# Patient Record
Sex: Female | Born: 1980
Health system: Southern US, Community
[De-identification: ages and names within clinical notes are randomized; demographics above are authoritative.]

## PROBLEM LIST (undated history)

## (undated) DIAGNOSIS — E079 Disorder of thyroid, unspecified: Secondary | ICD-10-CM

## (undated) DIAGNOSIS — F32A Depression, unspecified: Secondary | ICD-10-CM

## (undated) DIAGNOSIS — E063 Autoimmune thyroiditis: Secondary | ICD-10-CM

## (undated) DIAGNOSIS — E039 Hypothyroidism, unspecified: Secondary | ICD-10-CM

## (undated) DIAGNOSIS — F419 Anxiety disorder, unspecified: Secondary | ICD-10-CM

## (undated) HISTORY — PX: ANAL FISSURECTOMY: SUR608

## (undated) HISTORY — DX: Anxiety disorder, unspecified: F41.9

## (undated) HISTORY — DX: Autoimmune thyroiditis: E06.3

## (undated) HISTORY — DX: Depression, unspecified: F32.A

---

## 2005-04-11 ENCOUNTER — Ambulatory Visit: Payer: Self-pay | Admitting: Family Medicine

## 2005-07-19 ENCOUNTER — Ambulatory Visit: Payer: Self-pay | Admitting: Family Medicine

## 2005-07-31 ENCOUNTER — Inpatient Hospital Stay: Payer: Self-pay | Admitting: Obstetrics and Gynecology

## 2008-05-06 ENCOUNTER — Emergency Department: Payer: Self-pay | Admitting: Emergency Medicine

## 2008-08-30 ENCOUNTER — Emergency Department: Payer: Self-pay | Admitting: Emergency Medicine

## 2008-09-09 ENCOUNTER — Emergency Department: Payer: Self-pay | Admitting: Emergency Medicine

## 2009-03-23 ENCOUNTER — Inpatient Hospital Stay: Payer: Self-pay | Admitting: Obstetrics and Gynecology

## 2011-04-06 ENCOUNTER — Emergency Department: Payer: Self-pay | Admitting: Emergency Medicine

## 2013-07-04 ENCOUNTER — Emergency Department: Payer: Self-pay | Admitting: Emergency Medicine

## 2013-07-10 ENCOUNTER — Emergency Department: Payer: Self-pay | Admitting: Emergency Medicine

## 2013-07-14 ENCOUNTER — Emergency Department: Payer: Self-pay | Admitting: Emergency Medicine

## 2014-01-05 ENCOUNTER — Emergency Department: Payer: Self-pay | Admitting: Emergency Medicine

## 2014-01-08 ENCOUNTER — Emergency Department: Payer: Self-pay | Admitting: Emergency Medicine

## 2014-01-08 LAB — CBC
HCT: 36.2 % (ref 35.0–47.0)
HGB: 11.6 g/dL — AB (ref 12.0–16.0)
MCH: 26.9 pg (ref 26.0–34.0)
MCHC: 32.2 g/dL (ref 32.0–36.0)
MCV: 84 fL (ref 80–100)
Platelet: 285 10*3/uL (ref 150–440)
RBC: 4.33 10*6/uL (ref 3.80–5.20)
RDW: 14.1 % (ref 11.5–14.5)
WBC: 14.3 10*3/uL — ABNORMAL HIGH (ref 3.6–11.0)

## 2014-01-08 LAB — URINALYSIS, COMPLETE
Bacteria: NONE SEEN
Bilirubin,UR: NEGATIVE
Blood: NEGATIVE
GLUCOSE, UR: NEGATIVE mg/dL (ref 0–75)
Ketone: NEGATIVE
Leukocyte Esterase: NEGATIVE
Nitrite: NEGATIVE
PROTEIN: NEGATIVE
Ph: 7 (ref 4.5–8.0)
RBC,UR: 3 /HPF (ref 0–5)
SPECIFIC GRAVITY: 1.021 (ref 1.003–1.030)

## 2014-01-08 LAB — COMPREHENSIVE METABOLIC PANEL
AST: 14 U/L — AB (ref 15–37)
Albumin: 3.4 g/dL (ref 3.4–5.0)
Alkaline Phosphatase: 58 U/L
Anion Gap: 4 — ABNORMAL LOW (ref 7–16)
BUN: 11 mg/dL (ref 7–18)
Bilirubin,Total: 0.3 mg/dL (ref 0.2–1.0)
CALCIUM: 8.6 mg/dL (ref 8.5–10.1)
Chloride: 103 mmol/L (ref 98–107)
Co2: 29 mmol/L (ref 21–32)
Creatinine: 0.78 mg/dL (ref 0.60–1.30)
EGFR (African American): >60
Glucose: 77 mg/dL (ref 65–99)
Osmolality: 270 (ref 275–301)
POTASSIUM: 3.9 mmol/L (ref 3.5–5.1)
SGPT (ALT): 16 U/L (ref 12–78)
SODIUM: 136 mmol/L (ref 136–145)
Total Protein: 7.5 g/dL (ref 6.4–8.2)

## 2014-08-13 ENCOUNTER — Emergency Department: Payer: Self-pay | Admitting: Internal Medicine

## 2014-08-13 LAB — COMPREHENSIVE METABOLIC PANEL
ALBUMIN: 3.8 g/dL (ref 3.4–5.0)
ALK PHOS: 31 U/L — AB
ALT: 9 U/L — AB
AST: 12 U/L — AB (ref 15–37)
Anion Gap: 6 — ABNORMAL LOW (ref 7–16)
BUN: 16 mg/dL (ref 7–18)
Bilirubin,Total: 0.3 mg/dL (ref 0.2–1.0)
CALCIUM: 8 mg/dL — AB (ref 8.5–10.1)
CHLORIDE: 107 mmol/L (ref 98–107)
Co2: 28 mmol/L (ref 21–32)
Creatinine: 0.73 mg/dL (ref 0.60–1.30)
Glucose: 81 mg/dL (ref 65–99)
Osmolality: 281 (ref 275–301)
Potassium: 3.8 mmol/L (ref 3.5–5.1)
Sodium: 141 mmol/L (ref 136–145)
TOTAL PROTEIN: 7.1 g/dL (ref 6.4–8.2)

## 2014-08-13 LAB — CBC WITH DIFFERENTIAL/PLATELET
BASOS PCT: 1.4 %
Basophil #: 0.1 10*3/uL (ref 0.0–0.1)
EOS ABS: 0.2 10*3/uL (ref 0.0–0.7)
Eosinophil %: 3.3 %
HCT: 38.4 % (ref 35.0–47.0)
HGB: 12.3 g/dL (ref 12.0–16.0)
Lymphocyte #: 1.6 10*3/uL (ref 1.0–3.6)
Lymphocyte %: 26.6 %
MCH: 27.9 pg (ref 26.0–34.0)
MCHC: 32.2 g/dL (ref 32.0–36.0)
MCV: 87 fL (ref 80–100)
MONO ABS: 0.4 x10 3/mm (ref 0.2–0.9)
MONOS PCT: 7.2 %
NEUTROS ABS: 3.8 10*3/uL (ref 1.4–6.5)
NEUTROS PCT: 61.5 %
Platelet: 271 10*3/uL (ref 150–440)
RBC: 4.42 10*6/uL (ref 3.80–5.20)
RDW: 14.1 % (ref 11.5–14.5)
WBC: 6.2 10*3/uL (ref 3.6–11.0)

## 2014-08-13 LAB — URINALYSIS, COMPLETE
BACTERIA: NONE SEEN
BILIRUBIN, UR: NEGATIVE
Blood: NEGATIVE
Glucose,UR: NEGATIVE mg/dL (ref 0–75)
Ketone: NEGATIVE
Leukocyte Esterase: NEGATIVE
Nitrite: NEGATIVE
PROTEIN: NEGATIVE
Ph: 6 (ref 4.5–8.0)
RBC,UR: 4 /HPF (ref 0–5)
SPECIFIC GRAVITY: 1.017 (ref 1.003–1.030)

## 2014-08-13 LAB — LIPASE, BLOOD: Lipase: 151 U/L (ref 73–393)

## 2014-08-16 ENCOUNTER — Emergency Department: Payer: Self-pay | Admitting: Emergency Medicine

## 2014-08-16 LAB — TROPONIN I: Troponin-I: <0.02 ng/mL

## 2015-01-19 ENCOUNTER — Emergency Department: Payer: Self-pay | Admitting: Emergency Medicine

## 2015-01-19 LAB — BASIC METABOLIC PANEL
Anion Gap: 8 (ref 7–16)
BUN: 10 mg/dL (ref 7–18)
CALCIUM: 8.5 mg/dL (ref 8.5–10.1)
CO2: 25 mmol/L (ref 21–32)
Chloride: 109 mmol/L — ABNORMAL HIGH (ref 98–107)
Creatinine: 0.64 mg/dL (ref 0.60–1.30)
EGFR (African American): >60
EGFR (Non-African Amer.): >60
Glucose: 56 mg/dL — ABNORMAL LOW (ref 65–99)
Osmolality: 280 (ref 275–301)
POTASSIUM: 4.1 mmol/L (ref 3.5–5.1)
Sodium: 142 mmol/L (ref 136–145)

## 2015-01-19 LAB — HCG, QUANTITATIVE, PREGNANCY: Beta Hcg, Quant.: 30062 m[IU]/mL — ABNORMAL HIGH

## 2015-01-19 LAB — CBC
HCT: 35.6 % (ref 35.0–47.0)
HGB: 11.6 g/dL — ABNORMAL LOW (ref 12.0–16.0)
MCH: 28.3 pg (ref 26.0–34.0)
MCHC: 32.6 g/dL (ref 32.0–36.0)
MCV: 87 fL (ref 80–100)
PLATELETS: 338 10*3/uL (ref 150–440)
RBC: 4.11 10*6/uL (ref 3.80–5.20)
RDW: 14 % (ref 11.5–14.5)
WBC: 11.5 10*3/uL — ABNORMAL HIGH (ref 3.6–11.0)

## 2015-12-21 ENCOUNTER — Ambulatory Visit: Payer: Self-pay

## 2016-01-10 ENCOUNTER — Ambulatory Visit: Payer: Self-pay

## 2016-01-10 ENCOUNTER — Encounter (INDEPENDENT_AMBULATORY_CARE_PROVIDER_SITE_OTHER): Payer: Self-pay

## 2017-12-18 ENCOUNTER — Telehealth: Payer: Self-pay | Admitting: Pharmacy Technician

## 2017-12-18 NOTE — Telephone Encounter (Signed)
Patient failed to provide current poi.  No additional medication assistance will be provided by [facility] without the required proof of income documentation.  Patient notified by letter.  Betty J. Kluttz Care Manager [facility] 

## 2018-07-14 DIAGNOSIS — E039 Hypothyroidism, unspecified: Secondary | ICD-10-CM | POA: Insufficient documentation

## 2019-08-19 ENCOUNTER — Other Ambulatory Visit: Payer: Self-pay

## 2019-08-19 ENCOUNTER — Encounter: Payer: Self-pay | Admitting: Emergency Medicine

## 2019-08-19 ENCOUNTER — Emergency Department
Admission: EM | Admit: 2019-08-19 | Discharge: 2019-08-19 | Disposition: A | Discharge status: Home / Self Care | Payer: Medicaid Other | Attending: Emergency Medicine | Admitting: Emergency Medicine

## 2019-08-19 DIAGNOSIS — R110 Nausea: Secondary | ICD-10-CM | POA: Diagnosis present

## 2019-08-19 DIAGNOSIS — R630 Anorexia: Secondary | ICD-10-CM | POA: Insufficient documentation

## 2019-08-19 DIAGNOSIS — F329 Major depressive disorder, single episode, unspecified: Secondary | ICD-10-CM | POA: Insufficient documentation

## 2019-08-19 DIAGNOSIS — F331 Major depressive disorder, recurrent, moderate: Secondary | ICD-10-CM | POA: Diagnosis not present

## 2019-08-19 DIAGNOSIS — R0602 Shortness of breath: Secondary | ICD-10-CM | POA: Insufficient documentation

## 2019-08-19 DIAGNOSIS — F32A Depression, unspecified: Secondary | ICD-10-CM

## 2019-08-19 HISTORY — DX: Disorder of thyroid, unspecified: E07.9

## 2019-08-19 LAB — URINALYSIS, COMPLETE (UACMP) WITH MICROSCOPIC
Bilirubin Urine: NEGATIVE
Glucose, UA: NEGATIVE mg/dL
Hgb urine dipstick: NEGATIVE
Ketones, ur: NEGATIVE mg/dL
Nitrite: NEGATIVE
Protein, ur: NEGATIVE mg/dL
Specific Gravity, Urine: 1.018 (ref 1.005–1.030)
pH: 7 (ref 5.0–8.0)

## 2019-08-19 LAB — CBC
HCT: 41.1 % (ref 36.0–46.0)
Hemoglobin: 13.2 g/dL (ref 12.0–15.0)
MCH: 26.5 pg (ref 26.0–34.0)
MCHC: 32.1 g/dL (ref 30.0–36.0)
MCV: 82.4 fL (ref 80.0–100.0)
Platelets: 352 10*3/uL (ref 150–400)
RBC: 4.99 MIL/uL (ref 3.87–5.11)
RDW: 14.6 % (ref 11.5–15.5)
WBC: 7 10*3/uL (ref 4.0–10.5)
nRBC: 0 % (ref 0.0–0.2)

## 2019-08-19 LAB — BASIC METABOLIC PANEL
Anion gap: 8 (ref 5–15)
BUN: 12 mg/dL (ref 6–20)
CO2: 23 mmol/L (ref 22–32)
Calcium: 9.1 mg/dL (ref 8.9–10.3)
Chloride: 108 mmol/L (ref 98–111)
Creatinine, Ser: 0.69 mg/dL (ref 0.44–1.00)
GFR calc Af Amer: >60 mL/min (ref >60)
GFR calc non Af Amer: >60 mL/min (ref >60)
Glucose, Bld: 88 mg/dL (ref 70–99)
Potassium: 4.4 mmol/L (ref 3.5–5.1)
Sodium: 139 mmol/L (ref 135–145)

## 2019-08-19 LAB — POC URINE PREG, ED: Preg Test, Ur: NEGATIVE

## 2019-08-19 LAB — TSH: TSH: 0.086 u[IU]/mL — ABNORMAL LOW (ref 0.350–4.500)

## 2019-08-19 MED ORDER — ONDANSETRON 4 MG PO TBDP
4.0000 mg | ORAL_TABLET | Freq: Once | ORAL | Status: AC
  Administered 2019-08-19: 17:00:00 4 mg via ORAL
  Filled 2019-08-19: qty 1

## 2019-08-19 MED ORDER — ESCITALOPRAM OXALATE 10 MG PO TABS: 10.0000 mg | ORAL_TABLET | Freq: Every day | ORAL | 2 refills | Status: DC

## 2019-08-19 MED ORDER — METOCLOPRAMIDE HCL 10 MG PO TABS: 10.0000 mg | ORAL_TABLET | Freq: Four times a day (QID) | ORAL | 0 refills | Status: DC | PRN

## 2019-08-19 MED ORDER — SODIUM CHLORIDE 0.9% FLUSH: 3.0000 mL | Freq: Once | INTRAVENOUS | Status: DC

## 2019-08-19 NOTE — BH Assessment (Signed)
Writer provided the patient with outpatient referral information: ____________________________ Follow up appointment w/[facility] Date:  Friday 28th, 2020 Time: 9:30am Address: [address],                     [place], [place] [facility] Counselor [place], [id number] [phone]

## 2019-08-19 NOTE — ED Triage Notes (Signed)
Pt c/o nausea x70month and states decreased sleep. States hx of hypothyroidism. PT tearful in triage. VSS . States increased weakness xfew days

## 2019-08-19 NOTE — Consult Note (Signed)
[facility]   Reason for Consult:  Depression Referring Physician:  EDP Patient Identification: Pamela Mooney MRN:  [id number] Principal Diagnosis: MDD (major depressive disorder), recurrent episode, moderate (HCC) Diagnosis:  Principal Problem:   MDD (major depressive disorder), recurrent episode, moderate (HCC)   Total Time spent with patient: 30 minutes  Subjective:   Pamela Mooney is a 38 y.o. female patient reports that she has been feeling very depressed recently.  She states that she has not been sleeping well and that she has had a loss of appetite but has not lost any weight yet.  She denies having any suicidal or homicidal ideations and denies any hallucinations.  She reports that she has been crying and feeling very depressed.  She states that this was mainly triggered due to both of her parents dying within 6 months of each other last year.  She states that she was incarcerated during that time due to a parole violation.  She states she got out of jail in October and has not really had any closure with her parents dying while she was incarcerated.  She states that in the past she was on Lexapro and Rexulti prior to being incarcerated but the jail did not carry this medications and she was switched to a different medication which did not work.  She states she has not followed up with anyone since she got out of jail in October.  She states that the Lexapro did help her when she was taking it and would like to restart it.  Patient is in agreement and wants to go to some therapy but is unsure for her to follow-up at as she had only been to 1 of the place which was [facility] prior to her incarceration.  HPI:  Per Dr. Lenard LancePaduchowski: 38 y.o. female with a past medical history of hypothyroidism presents to the emergency department for nausea, shortness of breath, decreased appetite, decreased sleep.  According to the patient she has been under a lot of stress lately, for the  past 1 to 2 months she feels like she is nauseated throughout the day, has not been sleeping well at night, has no appetite and no energy per patient.  Patient is quite tearful during examination.  Does have a history of depression as well.  Denies any SI or HI.  Patient called her doctor but cannot be seen for several weeks so she came to the emergency department.  Patient is seen by this provider face-to-face.  Patient is pleasant and cooperative but is extremely tearful during the entire evaluation.  Patient has continually denied any suicidal or homicidal ideations and denied any hallucinations.  Due to her continuous depression feel that the patient does need to have some treatment started.  Would recommend to start the patient on Lexapro 10 mg p.o. daily and for patient to follow-up with [facility] and with hospice for grievance counseling.  Social worker has already assist the patient with having a appointment with [facility] on Friday the 28th at 9:30 AM and is also provided the patient with the information for the hospice care center.  At this time the patient does not meet inpatient criteria and is psychiatrically cleared.  I have notified Dr. Lenard LancePaduchowski about the recommendations.  Past Psychiatric History: depression and anxiety  Risk to Self:   Risk to Others:   Prior Inpatient Therapy:   Prior Outpatient Therapy:    Past Medical History:  Past Medical History:  Diagnosis Date  .  Thyroid disease    History reviewed. No pertinent surgical history. Family History: No family history on file. Family Psychiatric  History: none reported Social History:  Social History   Substance and Sexual Activity  Alcohol Use Not Currently     Social History   Substance and Sexual Activity  Drug Use Never    Social History   Socioeconomic History  . Marital status: Married    Spouse name: Not on file  . Number of children: Not on file  . Years of education: Not on file  . Highest education level: Not  on file  Occupational History  . Not on file  Social Needs  . Financial resource strain: Not on file  . Food insecurity    Worry: Not on file    Inability: Not on file  . Transportation needs    Medical: Not on file    Non-medical: Not on file  Tobacco Use  . Smoking status: Never Smoker  . Smokeless tobacco: Never Used  Substance and Sexual Activity  . Alcohol use: Not Currently  . Drug use: Never  . Sexual activity: Not on file  Lifestyle  . Physical activity    Days per week: Not on file    Minutes per session: Not on file  . Stress: Not on file  Relationships  . Social Herbalist on phone: Not on file    Gets together: Not on file    Attends religious service: Not on file    Active member of club or organization: Not on file    Attends meetings of clubs or organizations: Not on file    Relationship status: Not on file  Other Topics Concern  . Not on file  Social History Narrative  . Not on file   Additional Social History:    Allergies:  No Known Allergies  Labs:  Results for orders placed or performed during the hospital encounter of 08/19/19 (from the past 48 hour(s))  Urinalysis, Complete w Microscopic     Status: Abnormal   Collection Time: 08/19/19  3:25 PM  Result Value Ref Range   Color, Urine YELLOW (A) YELLOW   APPearance HAZY (A) CLEAR   Specific Gravity, Urine 1.018 1.005 - 1.030   pH 7.0 5.0 - 8.0   Glucose, UA NEGATIVE NEGATIVE mg/dL   Hgb urine dipstick NEGATIVE NEGATIVE   Bilirubin Urine NEGATIVE NEGATIVE   Ketones, ur NEGATIVE NEGATIVE mg/dL   Protein, ur NEGATIVE NEGATIVE mg/dL   Nitrite NEGATIVE NEGATIVE   Leukocytes,Ua TRACE (A) NEGATIVE   RBC / HPF 0-5 0 - 5 RBC/hpf   WBC, UA 0-5 0 - 5 WBC/hpf   Bacteria, UA RARE (A) NONE SEEN   Squamous Epithelial / LPF 6-10 0 - 5   Mucus PRESENT     Comment: Performed at [facility], [address] [place], [place] [address]  Basic metabolic panel     Status: None    Collection Time: 08/19/19  3:26 PM  Result Value Ref Range   Sodium 139 135 - 145 mmol/L   Potassium 4.4 3.5 - 5.1 mmol/L   Chloride 108 98 - 111 mmol/L   CO2 23 22 - 32 mmol/L   Glucose, Bld 88 70 - 99 mg/dL   BUN 12 6 - 20 mg/dL   Creatinine, Ser 0.69 0.44 - 1.00 mg/dL   Calcium 9.1 8.9 - 10.3 mg/dL   GFR calc non Af Amer >60 >60 mL/min   GFR  calc Af Amer >60 >60 mL/min   Anion gap 8 5 - 15    Comment: Performed at [facility], [address] [place], [place] [address]  CBC     Status: None   Collection Time: 08/19/19  3:26 PM  Result Value Ref Range   WBC 7.0 4.0 - 10.5 K/uL   RBC 4.99 3.87 - 5.11 MIL/uL   Hemoglobin 13.2 12.0 - 15.0 g/dL   HCT 09.3 26.7 - 12.4 %   MCV 82.4 80.0 - 100.0 fL   MCH 26.5 26.0 - 34.0 pg   MCHC 32.1 30.0 - 36.0 g/dL   RDW 58.0 99.8 - 33.8 %   Platelets 352 150 - 400 K/uL   nRBC 0.0 0.0 - 0.2 %    Comment: Performed at [facility], [address] [place], [place] [address]  TSH     Status: Abnormal   Collection Time: 08/19/19  3:26 PM  Result Value Ref Range   TSH 0.086 (L) 0.350 - 4.500 uIU/mL    Comment: Performed by a 3rd Generation assay with a functional sensitivity of <=0.01 uIU/mL. Performed at [facility], [address] [place], [place] [address]   POC urine preg, ED     Status: None   Collection Time: 08/19/19  3:35 PM  Result Value Ref Range   Preg Test, Ur Negative Negative    No current facility-administered medications for this encounter.    No current outpatient medications on file.    Musculoskeletal: Strength & Muscle Tone: within normal limits Gait & Station: normal Patient leans: N/A  Psychiatric Specialty Exam: Physical Exam  Nursing note and vitals reviewed. Constitutional: She is oriented to person, place, and time. She appears well-developed and well-nourished.  Cardiovascular: Normal rate.  Respiratory: Effort normal.  Musculoskeletal: Normal range of motion.   Neurological: She is alert and oriented to person, place, and time.    Review of Systems  Constitutional: Negative.   HENT: Negative.   Eyes: Negative.   Respiratory: Negative.   Cardiovascular: Negative.   Gastrointestinal: Negative.   Genitourinary: Negative.   Musculoskeletal: Negative.   Skin: Negative.   Neurological: Negative.   Endo/Heme/Allergies: Negative.   Psychiatric/Behavioral: Positive for depression. The patient is nervous/anxious.     Blood pressure 108/77, pulse 63, resp. rate 18, last menstrual period 08/11/2019, SpO2 99 %.There is no height or weight on file to calculate BMI.  General Appearance: Disheveled  Eye Contact:  Good  Speech:  Clear and Coherent and Normal Rate  Volume:  Normal  Mood:  Depressed  Affect:  Depressed and Tearful  Thought Process:  Coherent and Descriptions of Associations: Intact  Orientation:  Full (Time, Place, and Person)  Thought Content:  WDL  Suicidal Thoughts:  No  Homicidal Thoughts:  No  Memory:  Immediate;   Good Recent;   Good Remote;   Good  Judgement:  Good  Insight:  Fair  Psychomotor Activity:  Normal  Concentration:  Concentration: Good  Recall:  Good  Fund of Knowledge:  Good  Language:  Good  Akathisia:  No  Handed:  Right  AIMS (if indicated):     Assets:  Communication Skills Desire for Improvement Financial Resources/Insurance Housing Physical Health Resilience Social Support Transportation  ADL's:  Intact  Cognition:  WNL  Sleep:        Treatment Plan Summary: Follow up with [facility] for therapy Follow up with hospice for grievance therapy Start Lexapro 10 mg PO Daily  Disposition: No evidence of imminent risk to self or others at present.   Patient does not meet criteria for psychiatric inpatient admission. Supportive therapy provided about ongoing stressors. Discussed crisis plan, support from social network, calling 911, coming to the Emergency Department, and calling Suicide  Hotline.  Gerlene Burdockravis B Padraig Nhan, FNP 08/19/2019 6:45 PM

## 2019-08-19 NOTE — BH Assessment (Signed)
Assessment Note  Pamela Mooney is an 38 y.o. female who presents to the ER due to having increase symptoms of depression. Patient initially thought her mood was a result of her thyroid. Patient reports, she had dealt with her current level of depression for several months. Symptoms include; crying spells, problems with sleep and appetite.  Patient further reports her both of her parents passed last year approximately six months apart. The anniversary of the mother's death was yesterday (08/18/2019).  During the interview, the patient was calm, cooperative and pleasant. She was able to provide appropriate answers to the questions. Patient was tearful throughout the interview. She had insight into some of the cause of her mental and emotional state. Patient feels guilty because she was incarcerated when her parents passed. She was "locked up" for a probation violation, "which was something simple. She states she hasn't had closure because she wasn't there physically.  Throughout the interview, the patient denied SI/HI and AV/H.  Patient lives with her husband and two children. Her family is supportive and serves as a protective factor for wanting to live and get better.    In the past, she received outpatient treatment with [facility] and was receiving medication management. When she was in jail, they were unable to continue her regiment of medication that work for her.  Diagnosis: Depression  Past Medical History:  Past Medical History:  Diagnosis Date  . Thyroid disease     History reviewed. No pertinent surgical history.  Family History: No family history on file.  Social History:  reports that she has never smoked. She has never used smokeless tobacco. She reports previous alcohol use. She reports that she does not use drugs.  Additional Social History:  Alcohol / Drug Use Pain Medications: See PTA Prescriptions: See PTA Over the Counter: See PTA History of alcohol / drug use?: No history of  alcohol / drug abuse Longest period of sobriety (when/how long): Reports of no use  CIWA: CIWA-Ar BP: 116/79 Pulse Rate: 63 COWS:    Allergies: No Known Allergies  Home Medications: (Not in a hospital admission)   OB/GYN Status:  Patient's last menstrual period was 08/11/2019.  General Assessment Data [facility] Assessment: In system Is this a Tele or Face-to-Face Assessment?: Face-to-Face Is this an Initial Assessment or a Re-assessment for this encounter?: Initial Assessment Language Other than English: No Living Arrangements: Other (Comment)(Private Home) What gender do you identify as?: Female Marital status: Married Pregnancy Status: No Living Arrangements: Spouse/significant other, Children Can pt return to current living arrangement?: Yes Admission Status: Voluntary Is patient capable of signing voluntary admission?: Yes Referral Source: Self/Family/Friend Insurance type: Medicaid  Medical Screening Exam [facility] Walk-in ONLY) Medical Exam completed: Yes  Crisis Care Plan Living Arrangements: Spouse/significant other, Children Legal Guardian: Other:(Self) Name of Psychiatrist: Reports of none Name of Therapist: Reports of none  Education Status Is patient currently in school?: No Is the patient employed, unemployed or receiving disability?: Employed  Risk to self with the past 6 months Suicidal Ideation: No Has patient been a risk to self within the past 6 months prior to admission? : No Suicidal Intent: No Has patient had any suicidal intent within the past 6 months prior to admission? : No Is patient at risk for suicide?: No Suicidal Plan?: No Has patient had any suicidal plan within the past 6 months prior to admission? : No Access to Means: No What has been your use of drugs/alcohol within the  last 12 months?: Reports of none Previous Attempts/Gestures: No How many times?: 0 Other Self Harm Risks: Reports of none Triggers for  Past Attempts: None known Intentional Self Injurious Behavior: None Family Suicide History: No Recent stressful life event(s): Other (Comment) Persecutory voices/beliefs?: No Depression: Yes Depression Symptoms: Tearfulness, Isolating, Fatigue, Guilt, Loss of interest in usual pleasures, Feeling worthless/self pity Substance abuse history and/or treatment for substance abuse?: No Suicide prevention information given to non-admitted patients: Not applicable  Risk to Others within the past 6 months Homicidal Ideation: No Does patient have any lifetime risk of violence toward others beyond the six months prior to admission? : No Thoughts of Harm to Others: No Current Homicidal Intent: No Current Homicidal Plan: No Access to Homicidal Means: No Identified Victim: Reports of none History of harm to others?: No Violent Behavior Description: Reports of none Does patient have access to weapons?: No Does patient have a court date: No Is patient on probation?: No  Psychosis Hallucinations: None noted Delusions: None noted  Mental Status Report Appearance/Hygiene: Unremarkable, In scrubs Eye Contact: Good Motor Activity: Freedom of movement, Unremarkable Speech: Logical/coherent, Unremarkable Level of Consciousness: Alert Mood: Depressed, Anxious, Sad, Pleasant Affect: Appropriate to circumstance, Depressed, Sad, Anxious Anxiety Level: Minimal Thought Processes: Coherent, Relevant Judgement: Unimpaired Orientation: Person, Place, Time, Situation, Appropriate for developmental age Obsessive Compulsive Thoughts/Behaviors: None  Cognitive Functioning Concentration: Decreased Memory: Recent Intact, Remote Intact Is patient IDD: No Insight: Good Impulse Control: Fair Appetite: Fair Have you had any weight changes? : No Change Sleep: No Change Total Hours of Sleep: 6 Vegetative Symptoms: None  ADLScreening [facility] Assessment Services) Patient's cognitive ability adequate to safely  complete daily activities?: Yes Patient able to express need for assistance with ADLs?: Yes Independently performs ADLs?: Yes (appropriate for developmental age)  Prior Inpatient Therapy Prior Inpatient Therapy: No  Prior Outpatient Therapy Prior Outpatient Therapy: Yes Prior Therapy Dates: "Years ago" Prior Therapy Facilty/Provider(s): [facility] Reason for Treatment: Depression Does patient have an ACCT team?: No Does patient have [facility] services? : No Does patient have P4CC services?: No  ADL Screening (condition at time of admission) Patient's cognitive ability adequate to safely complete daily activities?: Yes Is the patient deaf or have difficulty hearing?: No Does the patient have difficulty seeing, even when wearing glasses/contacts?: No Does the patient have difficulty concentrating, remembering, or making decisions?: No Patient able to express need for assistance with ADLs?: Yes Does the patient have difficulty dressing or bathing?: No Independently performs ADLs?: Yes (appropriate for developmental age) Does the patient have difficulty walking or climbing stairs?: No Weakness of Legs: None Weakness of Arms/Hands: None  Home Assistive Devices/Equipment Home Assistive Devices/Equipment: None  Therapy Consults (therapy consults require a physician order) PT Evaluation Needed: No OT Evalulation Needed: No SLP Evaluation Needed: No Abuse/Neglect Assessment (Assessment to be complete while patient is alone) Abuse/Neglect Assessment Can Be Completed: Yes Physical Abuse: Denies Verbal Abuse: Denies Sexual Abuse: Denies Exploitation of patient/patient's resources: Denies Self-Neglect: Denies Values / Beliefs Cultural Requests During Hospitalization: None Spiritual Requests During Hospitalization: None Consults Spiritual Care Consult Needed: No Social Work Consult Needed: No Regulatory affairs officer (For Healthcare) Does Patient Have a Medical Advance Directive?: No        Child/Adolescent Assessment Running Away Risk: Denies(Patient is an adult)  Disposition:  Disposition Initial Assessment Completed for this Encounter: Yes  On Site Evaluation by:   Reviewed with Physician:    Gunnar Fusi MS, [place], [facility], [place], [place] Therapeutic Triage Specialist 08/19/2019 9:14  PM

## 2019-08-19 NOTE — ED Provider Notes (Signed)
[facility] Emergency Department Provider Note  Time seen: 4:24 PM  I have reviewed the triage vital signs and the nursing notes.   HISTORY  Chief Complaint Nausea  HPI Pamela Mooney is a 38 y.o. female with a past medical history of hypothyroidism presents to the emergency department for nausea, shortness of breath, decreased appetite, decreased sleep.  According to the patient she has been under a lot of stress lately, for the past 1 to 2 months she feels like she is nauseated throughout the day, has not been sleeping well at night, has no appetite and no energy per patient.  Patient is quite tearful during examination.  Does have a history of depression as well.  Denies any SI or HI.  Patient called her doctor but cannot be seen for several weeks so she came to the emergency department.   Past Medical History:  Diagnosis Date  . Thyroid disease     There are no active problems to display for this patient.   History reviewed. No pertinent surgical history.  Prior to Admission medications   Not on File    No Known Allergies  No family history on file.  Social History Social History   Tobacco Use  . Smoking status: Never Smoker  . Smokeless tobacco: Never Used  Substance Use Topics  . Alcohol use: Not Currently  . Drug use: Never    Review of Systems Constitutional: Negative for fever. Cardiovascular: Negative for chest pain. Respiratory: Patient states mild shortness of breath x2 months which she describes as feeling like she occasionally needs to take a big deep breath. Gastrointestinal: Negative for abdominal pain Musculoskeletal: Negative for musculoskeletal complaints Neurological: Negative for headache All other ROS negative  ____________________________________________   PHYSICAL EXAM:  VITAL SIGNS: ED Triage Vitals [08/19/19 1521]  Enc Vitals Group     BP      Pulse      Resp      Temp      Temp src      SpO2    Weight      Height      Head Circumference      Peak Flow      Pain Score 5     Pain Loc      Pain Edu?      Excl. in GC?    Constitutional: Alert and oriented.  Tearful. Eyes: Normal exam ENT      Head: Normocephalic and atraumatic.      Mouth/Throat: Mucous membranes are moist. Cardiovascular: Normal rate, regular rhythm.  Respiratory: Normal respiratory effort without tachypnea nor retractions. Breath sounds are clear  Gastrointestinal: Soft and nontender. No distention.   Musculoskeletal: Nontender with normal range of motion in all extremities.  Neurologic:  Normal speech and language. No gross focal neurologic deficits  Skin:  Skin is warm, dry and intact.  Psychiatric: Mood and affect are normal.  ____________________________________________    EKG  EKG viewed and interpreted by myself shows a normal sinus rhythm 83 bpm with a narrow QRS, normal axis, normal intervals, no concerning ST changes.  ____________________________________________    INITIAL IMPRESSION / ASSESSMENT AND PLAN / ED COURSE  Pertinent labs & imaging results that were available during my care of the patient were reviewed by me and considered in my medical decision making (see chart for details).   Patient presents emergency department for complaints of nausea, decreased appetite, decreased energy level, fatigue.  Differential would include metabolic or  electrolyte abnormality, thyroid abnormality, depression.  Patient's labs are largely within normal limits including negative urinalysis and negative pregnancy test.  Blood work is largely normal.  Patient is quite tearful throughout exam.  Highly suspect depression to be playing a role in the patient's symptoms.  With the patient's work-up being normal, benign abdominal exam I do not believe imaging would be of benefit for the patient.  We will treat her nausea with Zofran in the emergency department although it seems to be fairly minimal at this point.   Given the patient being tearful we will consult psychiatry and have them evaluate the patient as well.  Medical work-up is largely nonrevealing.  Patient has been seen by psychiatry they referred for outpatient therapy they wish to start the patient on 10 mg of Lexapro daily which I will write for the patient.  Patient agreeable plan of care and we will discharge from the emergency department at this time.   Pamela Mooney was evaluated in Emergency Department on 08/19/2019 for the symptoms described in the history of present illness. She was evaluated in the context of the global COVID-19 pandemic, which necessitated consideration that the patient might be at risk for infection with the SARS-CoV-2 virus that causes COVID-19. Institutional protocols and algorithms that pertain to the evaluation of patients at risk for COVID-19 are in a state of rapid change based on information released by regulatory bodies including the CDC and federal and state organizations. These policies and algorithms were followed during the patient's care in the ED.  ____________________________________________   FINAL CLINICAL IMPRESSION(S) / ED DIAGNOSES  Nausea Depression   Harvest Dark, MD 08/19/19 1905

## 2020-01-18 LAB — FETAL NONSTRESS TEST

## 2020-04-05 ENCOUNTER — Other Ambulatory Visit: Payer: Self-pay

## 2020-04-05 ENCOUNTER — Emergency Department: Payer: Medicaid Other

## 2020-04-05 ENCOUNTER — Emergency Department
Admission: EM | Admit: 2020-04-05 | Discharge: 2020-04-05 | Disposition: A | Discharge status: Home / Self Care | Payer: Medicaid Other | Attending: Emergency Medicine | Admitting: Emergency Medicine

## 2020-04-05 DIAGNOSIS — Y92009 Unspecified place in unspecified non-institutional (private) residence as the place of occurrence of the external cause: Secondary | ICD-10-CM

## 2020-04-05 DIAGNOSIS — Y999 Unspecified external cause status: Secondary | ICD-10-CM | POA: Insufficient documentation

## 2020-04-05 DIAGNOSIS — Z23 Encounter for immunization: Secondary | ICD-10-CM | POA: Insufficient documentation

## 2020-04-05 DIAGNOSIS — W19XXXA Unspecified fall, initial encounter: Secondary | ICD-10-CM

## 2020-04-05 DIAGNOSIS — S81812A Laceration without foreign body, left lower leg, initial encounter: Secondary | ICD-10-CM

## 2020-04-05 DIAGNOSIS — Y939 Activity, unspecified: Secondary | ICD-10-CM | POA: Diagnosis not present

## 2020-04-05 DIAGNOSIS — M25512 Pain in left shoulder: Secondary | ICD-10-CM | POA: Diagnosis not present

## 2020-04-05 DIAGNOSIS — Y929 Unspecified place or not applicable: Secondary | ICD-10-CM | POA: Diagnosis not present

## 2020-04-05 DIAGNOSIS — M25511 Pain in right shoulder: Secondary | ICD-10-CM | POA: Diagnosis not present

## 2020-04-05 DIAGNOSIS — S81012A Laceration without foreign body, left knee, initial encounter: Secondary | ICD-10-CM | POA: Diagnosis not present

## 2020-04-05 DIAGNOSIS — W01198A Fall on same level from slipping, tripping and stumbling with subsequent striking against other object, initial encounter: Secondary | ICD-10-CM | POA: Diagnosis not present

## 2020-04-05 DIAGNOSIS — M549 Dorsalgia, unspecified: Secondary | ICD-10-CM | POA: Insufficient documentation

## 2020-04-05 DIAGNOSIS — S8992XA Unspecified injury of left lower leg, initial encounter: Secondary | ICD-10-CM | POA: Diagnosis present

## 2020-04-05 MED ORDER — LIDOCAINE HCL (PF) 1 % IJ SOLN
5.0000 mL | Freq: Once | INTRAMUSCULAR | Status: AC
  Administered 2020-04-05: 5 mL
  Filled 2020-04-05: qty 5

## 2020-04-05 MED ORDER — ONDANSETRON 4 MG PO TBDP
4.0000 mg | ORAL_TABLET | Freq: Once | ORAL | Status: AC
  Administered 2020-04-05: 4 mg via ORAL
  Filled 2020-04-05: qty 1

## 2020-04-05 MED ORDER — HYDROCODONE-ACETAMINOPHEN 5-325 MG PO TABS
1.0000 | ORAL_TABLET | Freq: Once | ORAL | Status: AC
  Administered 2020-04-05: 1 via ORAL
  Filled 2020-04-05: qty 1

## 2020-04-05 MED ORDER — TETANUS-DIPHTH-ACELL PERTUSSIS 5-2.5-18.5 LF-MCG/0.5 IM SUSP
0.5000 mL | Freq: Once | INTRAMUSCULAR | Status: AC
  Administered 2020-04-05: 0.5 mL via INTRAMUSCULAR
  Filled 2020-04-05: qty 0.5

## 2020-04-05 NOTE — ED Notes (Signed)
See triage note  Presents with puncture wound   Laceration to left knee

## 2020-04-05 NOTE — Discharge Instructions (Signed)
Keep the wound clean, dry, and covered.  Use only soap and water to cleanse area as needed.  Change dressings daily.  Follow-up with [place] urgent care or local urgent care provider for suture removal in 10 to 12 days.

## 2020-04-05 NOTE — ED Triage Notes (Signed)
Pt states she tripped and fell landing on a piece of wood that punctured medial to the left knee. Bleeding is controlled.

## 2020-04-05 NOTE — ED Provider Notes (Signed)
[facility] Emergency Department Provider Note ____________________________________________  Time seen: 1800  I have reviewed the triage vital signs and the nursing notes.  HISTORY  Chief Complaint  Puncture Wound  HPI Pamela Mooney is a 39 y.o. female presents to the ED for evaluation of left knee pain.   Patient describes tripping and falling, and landed on a piece of wood that apparently cut the medial aspect of her left knee.  She presents, bleeding control to the left knee.  She also reports some mild bilateral upper shoulder and mid back pain secondary to the fall mechanism.  She denies any head injury or loss of consciousness.  Past Medical History:  Diagnosis Date  . Thyroid disease     Patient Active Problem List   Diagnosis Date Noted  . MDD (major depressive disorder), recurrent episode, moderate (HCC) 08/19/2019    History reviewed. No pertinent surgical history.  Prior to Admission medications   Medication Sig Start Date End Date Taking? Authorizing Provider  escitalopram (LEXAPRO) 10 MG tablet Take 1 tablet (10 mg total) by mouth daily. 08/19/19 11/17/19  Minna Antis, MD  metoCLOPramide (REGLAN) 10 MG tablet Take 1 tablet (10 mg total) by mouth every 6 (six) hours as needed for nausea. 08/19/19   Minna Antis, MD    Allergies Patient has no known allergies.  History reviewed. No pertinent family history.  Social History Social History   Tobacco Use  . Smoking status: Never Smoker  . Smokeless tobacco: Never Used  Substance Use Topics  . Alcohol use: Not Currently  . Drug use: Never    Review of Systems  Constitutional: Negative for fever. Eyes: Negative for visual changes. ENT: Negative for sore throat. Cardiovascular: Negative for chest pain. Respiratory: Negative for shortness of breath. Gastrointestinal: Negative for abdominal pain, vomiting and diarrhea. Genitourinary: Negative for dysuria. Musculoskeletal:  Negative for back pain.  Bilateral upper trapezius muscle pain as above. Skin: Negative for rash.  Left leg laceration as above. Neurological: Negative for headaches, focal weakness or numbness. ____________________________________________  PHYSICAL EXAM:  VITAL SIGNS: ED Triage Vitals  Enc Vitals Group     BP 04/05/20 1730 (!) 170/112     Pulse Rate 04/05/20 1729 99     Resp 04/05/20 1729 16     Temp 04/05/20 1729 98.3 F (36.8 C)     Temp Source 04/05/20 1729 Oral     SpO2 04/05/20 1729 100 %     Weight 04/05/20 1730 190 lb (86.2 kg)     Height 04/05/20 1730 5\' 7"  (1.702 m)     Head Circumference --      Peak Flow --      Pain Score 04/05/20 1730 8     Pain Loc --      Pain Edu? --      Excl. in GC? --     Constitutional: Alert and oriented. Well appearing and in no distress. Head: Normocephalic and atraumatic. Eyes: Conjunctivae are normal. PERRL. Normal extraocular movements Ears: Canals clear. TMs intact bilaterally. Nose: No congestion/rhinorrhea/epistaxis. Mouth/Throat: Mucous membranes are moist. Neck: Supple. No thyromegaly. Hematological/Lymphatic/Immunological: No cervical lymphadenopathy. Cardiovascular: Normal rate, regular rhythm. Normal distal pulses. Respiratory: Normal respiratory effort. No wheezes/rales/rhonchi. Gastrointestinal: Soft and nontender. No distention. Musculoskeletal: Nontender with normal range of motion in all extremities.  Neurologic:  Normal gait without ataxia. Normal speech and language. No gross focal neurologic deficits are appreciated. Skin:  Skin is warm, dry and intact. No rash noted. ____________________________________________  RADIOLOGY  DG Left Knee IMPRESSION: Mild soft tissue swelling medially. No radiopaque foreign body is noted. ____________________________________________  PROCEDURES  Tdap 0.5 ml IM  .Marland KitchenLaceration Repair  Date/Time: 04/05/2020 7:01 PM Performed by: Melvenia Needles, PA-C Authorized  by: Melvenia Needles, PA-C   Consent:    Consent obtained:  Verbal   Consent given by:  Patient   Risks discussed:  Pain and poor wound healing Anesthesia (see MAR for exact dosages):    Anesthesia method:  Local infiltration   Local anesthetic:  Lidocaine 1% w/o epi Laceration details:    Location:  Leg   Leg location:  L upper leg   Length (cm):  5   Depth (mm):  5 Repair type:    Repair type:  Simple Pre-procedure details:    Preparation:  Patient was prepped and draped in usual sterile fashion and imaging obtained to evaluate for foreign bodies Exploration:    Contaminated: no   Treatment:    Area cleansed with:  Betadine and saline   Amount of cleaning:  Standard   Irrigation method:  Syringe Skin repair:    Repair method:  Sutures   Suture size:  4-0   Suture material:  Nylon   Suture technique:  Simple interrupted   Number of sutures:  7 Approximation:    Approximation:  Close Post-procedure details:    Dressing:  Non-adherent dressing and bulky dressing   Patient tolerance of procedure:  Tolerated well, no immediate complications   ____________________________________________  INITIAL IMPRESSION / ASSESSMENT AND PLAN / ED COURSE  Patient with ED evaluation of an accidental laceration to the left leg following a mechanical fall.  Injury occurred after she fell against a piece of wood.  The wound involved extension into the subcutaneous fat, but no retained foreign bodies were explored in the wound bed.  Good wound edge approximation was achieved using nylon sutures.  Patient is discharged with wound care instructions and supplies.  She will follow-up with [place] urgent care for suture removal in 10 to 12 days.  Tetanus was updated at this time.  Patient also received a work note as requested at this visit.  Pamela Mooney was evaluated in Emergency Department on 04/05/2020 for the symptoms described in the history of present illness. She was evaluated in the  context of the global COVID-19 pandemic, which necessitated consideration that the patient might be at risk for infection with the SARS-CoV-2 virus that causes COVID-19. Institutional protocols and algorithms that pertain to the evaluation of patients at risk for COVID-19 are in a state of rapid change based on information released by regulatory bodies including the CDC and federal and state organizations. These policies and algorithms were followed during the patient's care in the ED. ____________________________________________  FINAL CLINICAL IMPRESSION(S) / ED DIAGNOSES  Final diagnoses:  Fall at home, initial encounter  Leg laceration, left, initial encounter      Melvenia Needles, PA-C 04/05/20 1930    Arta Silence, MD 04/05/20 2327

## 2020-04-27 ENCOUNTER — Other Ambulatory Visit: Payer: Self-pay | Admitting: Adult Health

## 2020-04-27 ENCOUNTER — Encounter: Payer: Self-pay | Admitting: Adult Health

## 2020-04-27 ENCOUNTER — Ambulatory Visit (INDEPENDENT_AMBULATORY_CARE_PROVIDER_SITE_OTHER): Payer: Medicaid Other | Admitting: Adult Health

## 2020-04-27 ENCOUNTER — Ambulatory Visit
Admission: RE | Admit: 2020-04-27 | Discharge: 2020-04-27 | Disposition: A | Discharge status: Home / Self Care | Payer: Medicaid Other | Attending: Adult Health | Admitting: Adult Health

## 2020-04-27 ENCOUNTER — Other Ambulatory Visit: Payer: Self-pay

## 2020-04-27 ENCOUNTER — Ambulatory Visit
Admission: RE | Admit: 2020-04-27 | Discharge: 2020-04-27 | Disposition: A | Discharge status: Home / Self Care | Payer: Medicaid Other | Source: Ambulatory Visit | Attending: Adult Health | Admitting: Adult Health

## 2020-04-27 VITALS — BP 132/110 | HR 102 | Temp 97.1°F | Resp 16 | Ht 67.0 in | Wt 191.4 lb

## 2020-04-27 DIAGNOSIS — R5383 Other fatigue: Secondary | ICD-10-CM

## 2020-04-27 DIAGNOSIS — F419 Anxiety disorder, unspecified: Secondary | ICD-10-CM | POA: Diagnosis not present

## 2020-04-27 DIAGNOSIS — E063 Autoimmune thyroiditis: Secondary | ICD-10-CM

## 2020-04-27 DIAGNOSIS — R062 Wheezing: Secondary | ICD-10-CM | POA: Diagnosis not present

## 2020-04-27 DIAGNOSIS — F1290 Cannabis use, unspecified, uncomplicated: Secondary | ICD-10-CM | POA: Insufficient documentation

## 2020-04-27 DIAGNOSIS — R110 Nausea: Secondary | ICD-10-CM | POA: Insufficient documentation

## 2020-04-27 DIAGNOSIS — Z13220 Encounter for screening for lipoid disorders: Secondary | ICD-10-CM

## 2020-04-27 DIAGNOSIS — F331 Major depressive disorder, recurrent, moderate: Secondary | ICD-10-CM

## 2020-04-27 DIAGNOSIS — Z0000 Encounter for general adult medical examination without abnormal findings: Secondary | ICD-10-CM

## 2020-04-27 DIAGNOSIS — R030 Elevated blood-pressure reading, without diagnosis of hypertension: Secondary | ICD-10-CM

## 2020-04-27 DIAGNOSIS — Z113 Encounter for screening for infections with a predominantly sexual mode of transmission: Secondary | ICD-10-CM

## 2020-04-27 DIAGNOSIS — Z6829 Body mass index (BMI) 29.0-29.9, adult: Secondary | ICD-10-CM

## 2020-04-27 LAB — POCT URINALYSIS DIPSTICK
Bilirubin, UA: NEGATIVE
Blood, UA: NEGATIVE
Glucose, UA: NEGATIVE
Ketones, UA: NEGATIVE
Leukocytes, UA: NEGATIVE
Nitrite, UA: NEGATIVE
Protein, UA: NEGATIVE
Spec Grav, UA: <=1.005 — AB (ref 1.010–1.025)
Urobilinogen, UA: 1 E.U./dL
pH, UA: 8 (ref 5.0–8.0)

## 2020-04-27 MED ORDER — ONDANSETRON HCL 4 MG PO TABS: 4.0000 mg | ORAL_TABLET | Freq: Three times a day (TID) | ORAL | 1 refills | Status: DC | PRN

## 2020-04-27 MED ORDER — PANTOPRAZOLE SODIUM 40 MG PO TBEC: 40.0000 mg | DELAYED_RELEASE_TABLET | Freq: Every day | ORAL | 0 refills | Status: DC

## 2020-04-27 MED ORDER — BUPROPION HCL ER (SR) 150 MG PO TB12: 150.0000 mg | ORAL_TABLET | Freq: Two times a day (BID) | ORAL | 0 refills | Status: DC

## 2020-04-27 NOTE — Progress Notes (Signed)
New patient visit   Patient: Pamela Mooney   DOB: 1981-11-20   38 y.o. Female  MRN: [id number] Visit Date: 04/27/2020  Today's healthcare provider: Jairo Ben, FNP   Chief Complaint  Patient presents with  . New Patient (Initial Visit)   Subjective    Pamela Mooney is a 39 y.o. female who presents today as a new patient to establish care.  HPI  Patient comes to office today to address symptoms of anxiety. Patient reports that she has suffered with anxiety since childhood, patient reports that she has previously seeked counseling at [facility] but states that she did not feel it was helping. Patient reports that she constantly has negative but denies thoughts of suicide or homicide.  Patient states that in the past two months she is unsure what triggered her anxiety but states that she wakes up every day in a panic attack and reports that it feels like someone is sitting on her chest when she wakes up. Patient states that she finds it difficult to eat and states that she is nauseas all the time.  Patient reports that two months ago she weighed a little over 230lbs and states that she has been losing weight because she cannot eat.  Patient reports that she has a dreadful fear when going outside her home and that anxiety is affecting her marriage and work. Patient reports that she has thyroid disease and unsure if nausea and loss of appetite is related to disease or anxiety.  Patient reports that she works as a Production designer, theatre/television/film of a coffee shop and states that she feels that her employer has been coming down more on her about break downs and wanting time to herself to collect herself. Patient states that she has been on medication in past such as Lexapro but she states that it makes her feel "spaced out.  Patient has also been on Cymbalta, Zoloft but states that she felt that these medications did not work. Patient states that she does have a history of smoking marijuana, she states she also  has a past history of drug use but did not got into detail, patient denies history of alcohol use. Patient has seen enocrinology in past for hypothyroidism and was on Levothyroxine.  She thinks she used Wellbutrin in past and done well. She has also used Klonopin PRN she reports.   She requests [facility]. She reports she was in correctional facility due to violating parol she does not report why she was on parole.   She has had all of her permanent teeth removed and has upper and lower dentures.   She has increased  Nausea with anxiety. Acid related likely due to stress. She is very tearful in office. She also has chest tightness and shortness of breath reported only with   She requests a note for 3 days off work due to anxiety.   Note reviewed : According to last note from [facility] on 10/10/18 History of hypothyroidism likely secondary to questionable Hashimoto's thyroiditis. Can not make any recommendations secondary to the Correctional Facility not sending requested labs. Patients thyroid hormone replacement has been managed by the [facility] Patient is being released in 9 days. Will again request all previous thyroid function tests obtained be faxed for my review. Will follow up with the patient out of the Correctional Facility in another 5 weeks.  Patient reports that she was in correctional facility for 9 months due to  probation violoation she states that when she was released she was being seen at [facility].     Patient  denies any fever, body aches,chills, rash, chest pain,  vomiting, or diarrhea. Denies dizziness, lightheaded, pre syncope or syncopal episodes.    Past Medical History:  Diagnosis Date  . Thyroid disease    No past surgical history on file. No family status information on file.   No family history on file. Social History   Socioeconomic History  . Marital status: Married    Spouse name: Not on file  . Number  of children: Not on file  . Years of education: Not on file  . Highest education level: Not on file  Occupational History  . Not on file  Tobacco Use  . Smoking status: Never Smoker  . Smokeless tobacco: Never Used  Substance and Sexual Activity  . Alcohol use: Not Currently  . Drug use: Never  . Sexual activity: Not on file  Other Topics Concern  . Not on file  Social History Narrative  . Not on file   Social Determinants of Health   Financial Resource Strain:   . Difficulty of Paying Living Expenses:   Food Insecurity:   . Worried About Programme researcher, broadcasting/film/video in the Last Year:   . Barista in the Last Year:   Transportation Needs:   . Freight forwarder (Medical):   Marland Kitchen Lack of Transportation (Non-Medical):   Physical Activity:   . Days of Exercise per Week:   . Minutes of Exercise per Session:   Stress:   . Feeling of Stress :   Social Connections:   . Frequency of Communication with Friends and Family:   . Frequency of Social Gatherings with Friends and Family:   . Attends Religious Services:   . Active Member of Clubs or Organizations:   . Attends Banker Meetings:   Marland Kitchen Marital Status:    Outpatient Medications Prior to Visit  Medication Sig  . escitalopram (LEXAPRO) 10 MG tablet Take 1 tablet (10 mg total) by mouth daily.  . metoCLOPramide (REGLAN) 10 MG tablet Take 1 tablet (10 mg total) by mouth every 6 (six) hours as needed for nausea.   No facility-administered medications prior to visit.   No Known Allergies  Immunization History  Administered Date(s) Administered  . Tdap 04/05/2020    Health Maintenance  Topic Date Due  . HIV Screening  Never done  . PAP SMEAR-Modifier  Never done  . INFLUENZA VACCINE  07/24/2020  . TETANUS/TDAP  04/05/2030    Patient Care Team: Berniece Pap, FNP as PCP - General (Family Medicine)  Review of Systems  Constitutional: Positive for appetite change and fatigue. Negative for activity  change, chills, diaphoresis, fever and unexpected weight change.  Respiratory: Positive for chest tightness and shortness of breath. Negative for apnea, cough, choking, wheezing and stridor.   Cardiovascular: Negative for chest pain, palpitations and leg swelling.  Gastrointestinal: Positive for constipation and nausea.  Endocrine: Negative for polydipsia, polyphagia and polyuria.  Genitourinary: Negative.        Patient's last menstrual period was 04/04/2020 (exact date). She denies any pregnancy chance and declines testing.   Musculoskeletal: Negative.   Neurological: Positive for weakness.  Hematological: Negative.   Psychiatric/Behavioral: Positive for decreased concentration. Negative for agitation, behavioral problems, confusion, dysphoric mood, hallucinations, self-injury, sleep disturbance and suicidal ideas. The patient is nervous/anxious. The patient is not hyperactive.  She denies any history now or in the past of suicidal or homicidal ideations or intents.     Last CBC Lab Results  Component Value Date   WBC 7.0 08/19/2019   HGB 13.2 08/19/2019   HCT 41.1 08/19/2019   MCV 82.4 08/19/2019   MCH 26.5 08/19/2019   RDW 14.6 08/19/2019   PLT 352 17/61/6073   Last metabolic panel Lab Results  Component Value Date   GLUCOSE 88 08/19/2019   NA 139 08/19/2019   K 4.4 08/19/2019   CL 108 08/19/2019   CO2 23 08/19/2019   BUN 12 08/19/2019   CREATININE 0.69 08/19/2019   GFRNONAA >60 08/19/2019   GFRAA >60 08/19/2019   CALCIUM 9.1 08/19/2019   PROT 7.1 08/13/2014   ALBUMIN 3.8 08/13/2014   BILITOT 0.3 08/13/2014   ALKPHOS 31 (L) 08/13/2014   AST 12 (L) 08/13/2014   ALT 9 (L) 08/13/2014   ANIONGAP 8 08/19/2019   Last lipids No results found for: CHOL, HDL, LDLCALC, LDLDIRECT, TRIG, CHOLHDL Last hemoglobin A1c No results found for: HGBA1C Last thyroid functions Lab Results  Component Value Date   TSH 0.086 (L) 08/19/2019   Last vitamin D No results found  for: 25OHVITD2, 25OHVITD3, VD25OH Last vitamin B12 and Folate No results found for: VITAMINB12, FOLATE    Objective    BP (!) 132/110   Pulse (!) 102   Temp (!) 97.1 F (36.2 C) (Oral)   Resp 16   Ht 5\' 7"  (1.702 m)   Wt 191 lb 6.4 oz (86.8 kg)   LMP 04/04/2020 (Exact Date)   SpO2 98%   BMI 29.98 kg/m  Physical Exam Vitals reviewed. Exam conducted with a chaperone present.  Constitutional:      Appearance: Normal appearance. She is normal weight.  HENT:     Head: Normocephalic and atraumatic.     Right Ear: External ear normal.     Left Ear: External ear normal.  Eyes:     Extraocular Movements: Extraocular movements intact.     Conjunctiva/sclera: Conjunctivae normal.  Cardiovascular:     Rate and Rhythm: Normal rate and regular rhythm.     Pulses: Normal pulses.     Heart sounds: Normal heart sounds.  Pulmonary:     Effort: Pulmonary effort is normal. No respiratory distress.     Breath sounds: Normal breath sounds. No stridor. No wheezing, rhonchi or rales.  Chest:     Chest wall: No tenderness.     Breasts: Tanner Score is 5.        Right: Normal. No inverted nipple, nipple discharge, skin change or tenderness.        Left: Normal. No inverted nipple, nipple discharge, skin change or tenderness.  Abdominal:     General: There is no distension.     Palpations: Abdomen is soft.     Tenderness: There is no abdominal tenderness.  Genitourinary:    Exam position: Lithotomy position.     Pubic Area: No rash or pubic lice.      Tanner stage (genital): 5.     Labia:        Right: No rash or tenderness.        Left: No rash or tenderness.      Urethra: No prolapse, urethral pain, urethral swelling or urethral lesion.     Vagina: Normal.     Cervix: Normal.     Uterus: Normal.      Adnexa: Right adnexa normal.  Rectum: No external hemorrhoid.  Musculoskeletal:     Cervical back: Normal range of motion and neck supple.  Lymphadenopathy:     Upper Body:      Right upper body: No supraclavicular, axillary or pectoral adenopathy.     Left upper body: No supraclavicular, axillary or pectoral adenopathy.  Skin:    General: Skin is dry.  Neurological:     Mental Status: She is alert and oriented to person, place, and time.     Motor: No weakness.     Gait: Gait normal.  Psychiatric:        Attention and Perception: Attention normal.        Mood and Affect: Mood normal.        Speech: Speech normal.        Behavior: Behavior normal. Behavior is cooperative.        Thought Content: Thought content normal.        Cognition and Memory: Cognition normal.        Judgment: Judgment normal.      Depression Screen PHQ 2/9 Scores 04/27/2020  PHQ - 2 Score 6  PHQ- 9 Score 24   Results for orders placed or performed in visit on 04/27/20  POCT urinalysis dipstick  Result Value Ref Range   Color, UA yellow    Clarity, UA clear    Glucose, UA Negative Negative   Bilirubin, UA negative    Ketones, UA negative    Spec Grav, UA <=1.005 (A) 1.010 - 1.025   Blood, UA negative    pH, UA 8.0 5.0 - 8.0   Protein, UA Negative Negative   Urobilinogen, UA 1.0 0.2 or 1.0 E.U./dL   Nitrite, UA negative    Leukocytes, UA Negative Negative   Appearance     Odor      Assessment & Plan     1. MDD (major depressive disorder), recurrent episode, moderate (HCC) PHQ 9 24  GAD 6  - Ambulatory referral to Psychiatry- urgent  She will start Wellbutrin 150 mg SR once daily for one week and if tolerating ok can increase to BID as below. Patient was instructed  - buPROPion (WELLBUTRIN SR) 150 MG 12 hr tablet; Take 1 tablet (150 mg total) by mouth 2 (two) times daily.  Dispense: 60 tablet; Refill: 0  2. Anxiety As above  - Ambulatory referral to Psychiatry - buPROPion (WELLBUTRIN SR) 150 MG 12 hr tablet; Take 1 tablet (150 mg total) by mouth 2 (two) times daily.  Dispense: 60 tablet; Refill: 0  3.Hypothyroidism likely secondary to Hashimoto's thyroiditis- history  has seen endocrinology.  Has not been seen  By endocrinology in over a year.  Will recheck her lab and decide on medication based on labs. She has not been taking since 2020 she reports.  She reports she was on synthroid but quit taking it and has not had levels checked in " a long while" .   - Comprehensive metabolic panel - T4 AND TSH  4. Wheezing vaping nicotine/ smoking cessation advised. Uses Marijuana daily reported.  - DG Chest 2 View; Future  5. Fatigue, unspecified type Likely due  To anxiety and untreated thyroid disease.  - CBC w/Diff/Platelet - Vitamin D (25 hydroxy) - B12  6. Nausea Increased acid will start PPI and give Zofran PRN only. She will need to return to office if not improving. Denies any chance of pregnancy. Patient's last menstrual period was 04/04/2020 (exact date).  - CBC w/Diff/Platelet  7.  Screening for lipid disorders  - Lipid panel  8. Marijuana use She agrees to drug test and reports she does use marijuana.  - Pain Mgt Scrn (14 Drugs), Ur  9. Health maintenance examination She has dentures upper and lower.  She has not had eye exam.  - POCT urinalysis dipstick   Orders Placed This Encounter  Procedures  . DG Chest 2 View    LMP 04/04/2020    Standing Status:   Future    Standing Expiration Date:   06/27/2021    Order Specific Question:   Reason for Exam (SYMPTOM  OR DIAGNOSIS REQUIRED)    Answer:   wheezing in upper lobe    Order Specific Question:   Is patient pregnant?    Answer:   No    Order Specific Question:   Preferred imaging location?    Answer:   [facility]    Order Specific Question:   Radiology Contrast Protocol - do NOT remove file path    Answer:   [email or web address]  . Lipid panel  . Comprehensive metabolic panel  . T4 AND TSH  . CBC w/Diff/Platelet  . Vitamin D (25 hydroxy)  . B12  . Pain Mgt Scrn (14 Drugs), Ur  . HIV antibody (with reflex)  . RPR  . Hepatitis,  Acute  . Ambulatory referral to Psychiatry    Referral Priority:   Urgent    Referral Type:   Psychiatric    Referral Reason:   Specialty Services Required    Requested Specialty:   Psychiatry    Number of Visits Requested:   1  . POCT urinalysis dipstick    Meds ordered this encounter  Medications  .     . ondansetron (ZOFRAN) 4 MG tablet    Sig: Take 1 tablet (4 mg total) by mouth every 8 (eight) hours as needed for nausea or vomiting.    Dispense:  20 tablet    Refill:  1  . pantoprazole (PROTONIX) 40 MG tablet    Sig: Take 1 tablet (40 mg total) by mouth daily.    Dispense:  42 tablet    Refill:  0  . buPROPion (WELLBUTRIN SR) 150 MG 12 hr tablet    Sig: Take 1 tablet (150 mg total) by mouth 2 (two) times daily. Start 150 mg daily for one week and then can increase as above.    Dispense:  60 tablet    Refill:  0   Return in about 2 weeks (around 05/11/2020), or if symptoms worsen or fail to improve, for pap exam and follow up blood pressure, Go to Emergency room/ urgent care if worse.  IBeverely Pace, Gaelyn Tukes Smith Aury Scollard, FNP, have reviewed all documentation for this visit. The documentation on 04/27/20 for the exam, diagnosis, procedures, and orders are all accurate and complete.  Advised patient call the office or your primary care doctor for an appointment if no improvement within 72 hours or if any symptoms change or worsen at any time  Advised ER or urgent Care if after hours or on weekend. Call 911 for emergency symptoms at any time.Patinet verbalized understanding of all instructions given/reviewed and treatment plan and has no further questions or concerns at this time.    Jairo BenMichelle Smith Sakiya Stepka, FNP  [facility] [phone] (phone) [phone] (fax)  [facility]

## 2020-04-27 NOTE — Patient Instructions (Addendum)
Health Maintenance, Female Adopting a healthy lifestyle and getting preventive care are important in promoting health and wellness. Ask your health care provider about:  The right schedule for you to have regular tests and exams.  Things you can do on your own to prevent diseases and keep yourself healthy. What should I know about diet, weight, and exercise? Eat a healthy diet   Eat a diet that includes plenty of vegetables, fruits, low-fat dairy products, and lean protein.  Do not eat a lot of foods that are high in solid fats, added sugars, or sodium. Maintain a healthy weight Body mass index (BMI) is used to identify weight problems. It estimates body fat based on height and weight. Your health care provider can help determine your BMI and help you achieve or maintain a healthy weight. Get regular exercise Get regular exercise. This is one of the most important things you can do for your health. Most adults should:  Exercise for at least 150 minutes each week. The exercise should increase your heart rate and make you sweat (moderate-intensity exercise).  Do strengthening exercises at least twice a week. This is in addition to the moderate-intensity exercise.  Spend less time sitting. Even light physical activity can be beneficial. Watch cholesterol and blood lipids Have your blood tested for lipids and cholesterol at 38 years of age, then have this test every 5 years. Have your cholesterol levels checked more often if:  Your lipid or cholesterol levels are high.  You are older than 38 years of age.  You are at high risk for heart disease. What should I know about cancer screening? Depending on your health history and family history, you may need to have cancer screening at various ages. This may include screening for:  Breast cancer.  Cervical cancer.  Colorectal cancer.  Skin cancer.  Lung cancer. What should I know about heart disease, diabetes, and high blood  pressure? Blood pressure and heart disease  High blood pressure causes heart disease and increases the risk of stroke. This is more likely to develop in people who have high blood pressure readings, are of African descent, or are overweight.  Have your blood pressure checked: ? Every 3-5 years if you are 18-39 years of age. ? Every year if you are 40 years old or older. Diabetes Have regular diabetes screenings. This checks your fasting blood sugar level. Have the screening done:  Once every three years after age 40 if you are at a normal weight and have a low risk for diabetes.  More often and at a younger age if you are overweight or have a high risk for diabetes. What should I know about preventing infection? Hepatitis B If you have a higher risk for hepatitis B, you should be screened for this virus. Talk with your health care provider to find out if you are at risk for hepatitis B infection. Hepatitis C Testing is recommended for:  Everyone born from 1945 through 1965.  Anyone with known risk factors for hepatitis C. Sexually transmitted infections (STIs)  Get screened for STIs, including gonorrhea and chlamydia, if: ? You are sexually active and are younger than 38 years of age. ? You are older than 38 years of age and your health care provider tells you that you are at risk for this type of infection. ? Your sexual activity has changed since you were last screened, and you are at increased risk for chlamydia or gonorrhea. Ask your health care provider if   you are at risk.  Ask your health care provider about whether you are at high risk for HIV. Your health care provider may recommend a prescription medicine to help prevent HIV infection. If you choose to take medicine to prevent HIV, you should first get tested for HIV. You should then be tested every 3 months for as long as you are taking the medicine. Pregnancy  If you are about to stop having your period (premenopausal) and  you may become pregnant, seek counseling before you get pregnant.  Take 400 to 800 micrograms (mcg) of folic acid every day if you become pregnant.  Ask for birth control (contraception) if you want to prevent pregnancy. Osteoporosis and menopause Osteoporosis is a disease in which the bones lose minerals and strength with aging. This can result in bone fractures. If you are 44 years old or older, or if you are at risk for osteoporosis and fractures, ask your health care provider if you should:  Be screened for bone loss.  Take a calcium or vitamin D supplement to lower your risk of fractures.  Be given hormone replacement therapy (HRT) to treat symptoms of menopause. Follow these instructions at home: Lifestyle  Do not use any products that contain nicotine or tobacco, such as cigarettes, e-cigarettes, and chewing tobacco. If you need help quitting, ask your health care provider.  Do not use street drugs.  Do not share needles.  Ask your health care provider for help if you need support or information about quitting drugs. Alcohol use  Do not drink alcohol if: ? Your health care provider tells you not to drink. ? You are pregnant, may be pregnant, or are planning to become pregnant.  If you drink alcohol: ? Limit how much you use to 0-1 drink a day. ? Limit intake if you are breastfeeding.  Be aware of how much alcohol is in your drink. In the [place] one drink equals one 12 oz bottle of beer (355 mL), one 5 oz glass of wine (148 mL), or one 1 oz glass of hard liquor (44 mL). General instructions  Schedule regular health, dental, and eye exams.  Stay current with your vaccines.  Tell your health care provider if: ? You often feel depressed. ? You have ever been abused or do not feel safe at home. Summary  Adopting a healthy lifestyle and getting preventive care are important in promoting health and wellness.  Follow your health care provider's instructions about healthy  diet, exercising, and getting tested or screened for diseases.  Follow your health care provider's instructions on monitoring your cholesterol and blood pressure. This information is not intended to replace advice given to you by your health care provider. Make sure you discuss any questions you have with your health care provider. Document Revised: 12/03/2018 Document Reviewed: 12/03/2018 Elsevier Patient Education  [place], Adult After being diagnosed with an anxiety disorder, you may be relieved to know why you have felt or behaved a certain way. You may also feel overwhelmed about the treatment ahead and what it will mean for your life. With care and support, you can manage this condition and recover from it. How to manage lifestyle changes Managing stress and anxiety  Stress is your body's reaction to life changes and events, both good and bad. Most stress will last just a few hours, but stress can be ongoing and can lead to more than just stress. Although stress can play a major role in  anxiety, it is not the same as anxiety. Stress is usually caused by something external, such as a deadline, test, or competition. Stress normally passes after the triggering event has ended.  Anxiety is caused by something internal, such as imagining a terrible outcome or worrying that something will go wrong that will devastate you. Anxiety often does not go away even after the triggering event is over, and it can become long-term (chronic) worry. It is important to understand the differences between stress and anxiety and to manage your stress effectively so that it does not lead to an anxious response. Talk with your health care provider or a counselor to learn more about reducing anxiety and stress. He or she may suggest tension reduction techniques, such as:  Music therapy. This can include creating or listening to music that you enjoy and that inspires  you.  Mindfulness-based meditation. This involves being aware of your normal breaths while not trying to control your breathing. It can be done while sitting or walking.  Centering prayer. This involves focusing on a word, phrase, or sacred image that means something to you and brings you peace.  Deep breathing. To do this, expand your stomach and inhale slowly through your nose. Hold your breath for 3-5 seconds. Then exhale slowly, letting your stomach muscles relax.  Self-talk. This involves identifying thought patterns that lead to anxiety reactions and changing those patterns.  Muscle relaxation. This involves tensing muscles and then relaxing them. Choose a tension reduction technique that suits your lifestyle and personality. These techniques take time and practice. Set aside 5-15 minutes a day to do them. Therapists can offer counseling and training in these techniques. The training to help with anxiety may be covered by some insurance plans. Other things you can do to manage stress and anxiety include:  Keeping a stress/anxiety diary. This can help you learn what triggers your reaction and then learn ways to manage your response.  Thinking about how you react to certain situations. You may not be able to control everything, but you can control your response.  Making time for activities that help you relax and not feeling guilty about spending your time in this way.  Visual imagery and yoga can help you stay calm and relax.  Medicines Medicines can help ease symptoms. Medicines for anxiety include:  Anti-anxiety drugs.  Antidepressants. Medicines are often used as a primary treatment for anxiety disorder. Medicines will be prescribed by a health care provider. When used together, medicines, psychotherapy, and tension reduction techniques may be the most effective treatment. Relationships Relationships can play a big part in helping you recover. Try to spend more time connecting  with trusted friends and family members. Consider going to couples counseling, taking family education classes, or going to family therapy. Therapy can help you and others better understand your condition. How to recognize changes in your anxiety Everyone responds differently to treatment for anxiety. Recovery from anxiety happens when symptoms decrease and stop interfering with your daily activities at home or work. This may mean that you will start to:  Have better concentration and focus. Worry will interfere less in your daily thinking.  Sleep better.  Be less irritable.  Have more energy.  Have improved memory. It is important to recognize when your condition is getting worse. Contact your health care provider if your symptoms interfere with home or work and you feel like your condition is not improving. Follow these instructions at home: Activity  Exercise. Most adults should  do the following: ? Exercise for at least 150 minutes each week. The exercise should increase your heart rate and make you sweat (moderate-intensity exercise). ? Strengthening exercises at least twice a week.  Get the right amount and quality of sleep. Most adults need 7-9 hours of sleep each night. Lifestyle   Eat a healthy diet that includes plenty of vegetables, fruits, whole grains, low-fat dairy products, and lean protein. Do not eat a lot of foods that are high in solid fats, added sugars, or salt.  Make choices that simplify your life.  Do not use any products that contain nicotine or tobacco, such as cigarettes, e-cigarettes, and chewing tobacco. If you need help quitting, ask your health care provider.  Avoid caffeine, alcohol, and certain over-the-counter cold medicines. These may make you feel worse. Ask your pharmacist which medicines to avoid. General instructions  Take over-the-counter and prescription medicines only as told by your health care provider.  Keep all follow-up visits as told  by your health care provider. This is important. Where to find support You can get help and support from these sources:  Self-help groups.  Online and [employer].  A trusted spiritual leader.  Couples counseling.  Family education classes.  Family therapy. Where to find more information You may find that joining a support group helps you deal with your anxiety. The following sources can help you locate counselors or support groups near you:  Mental Health America: www.mentalhealthamerica.net  Anxiety and Depression Association of [place] (ADAA): ProgramCam.de  [employer] Mental Illness (NAMI): www.nami.org Contact a health care provider if you:  Have a hard time staying focused or finishing daily tasks.  Spend many hours a day feeling worried about everyday life.  Become exhausted by worry.  Start to have headaches, feel tense, or have nausea.  Urinate more than normal.  Have diarrhea. Get help right away if you have:  A racing heart and shortness of breath.  Thoughts of hurting yourself or others. If you ever feel like you may hurt yourself or others, or have thoughts about taking your own life, get help right away. You can go to your nearest emergency department or call:  Your local emergency services (911 in the [place]  A suicide crisis helpline, such as the National Suicide Prevention Lifeline at (618)744-5225. This is open 24 hours a day. Summary  Taking steps to learn and use tension reduction techniques can help calm you and help prevent triggering an anxiety reaction.  When used together, medicines, psychotherapy, and tension reduction techniques may be the most effective treatment.  Family, friends, and partners can play a big part in helping you recover from an anxiety disorder. This information is not intended to replace advice given to you by your health care provider. Make sure you discuss any questions you have with your health  care provider. Document Revised: 05/12/2019 Document Reviewed: 05/12/2019 Elsevier Patient Education  2020 [employer]. Bupropion tablets (Depression/Mood Disorders) What is this medicine? BUPROPION (byoo PROE pee on) is used to treat depression. This medicine may be used for other purposes; ask your health care provider or pharmacist if you have questions. COMMON BRAND NAME(S): Wellbutrin What should I tell my health care provider before I take this medicine? They need to know if you have any of these conditions:  an eating disorder, such as anorexia or bulimia  bipolar disorder or psychosis  diabetes or high blood sugar, treated with medication  glaucoma  heart disease, previous heart attack,  or irregular heart beat  head injury or brain tumor  high blood pressure  kidney or liver disease  seizures  suicidal thoughts or a previous suicide attempt  Tourette's syndrome  weight loss  an unusual or allergic reaction to bupropion, other medicines, foods, dyes, or preservatives  breast-feeding  pregnant or trying to become pregnant How should I use this medicine? Take this medicine by mouth with a glass of water. Follow the directions on the prescription label. You can take it with or without food. If it upsets your stomach, take it with food. Take your medicine at regular intervals. Do not take your medicine more often than directed. Do not stop taking this medicine suddenly except upon the advice of your doctor. Stopping this medicine too quickly may cause serious side effects or your condition may worsen. A special MedGuide will be given to you by the pharmacist with each prescription and refill. Be sure to read this information carefully each time. Talk to your pediatrician regarding the use of this medicine in children. Special care may be needed. Overdosage: If you think you have taken too much of this medicine contact a poison control center or emergency room at  once. NOTE: This medicine is only for you. Do not share this medicine with others. What if I miss a dose? If you miss a dose, take it as soon as you can. If it is less than four hours to your next dose, take only that dose and skip the missed dose. Do not take double or extra doses. What may interact with this medicine? Do not take this medicine with any of the following medications:  linezolid  MAOIs like Azilect, Carbex, Eldepryl, Marplan, Nardil, and Parnate  methylene blue (injected into a vein)  other medicines that contain bupropion like Zyban This medicine may also interact with the following medications:  alcohol  certain medicines for anxiety or sleep  certain medicines for blood pressure like metoprolol, propranolol  certain medicines for depression or psychotic disturbances  certain medicines for HIV or AIDS like efavirenz, lopinavir, nelfinavir, ritonavir  certain medicines for irregular heart beat like propafenone, flecainide  certain medicines for Parkinson's disease like amantadine, levodopa  certain medicines for seizures like carbamazepine, phenytoin, phenobarbital  cimetidine  clopidogrel  cyclophosphamide  digoxin  furazolidone  isoniazid  nicotine  orphenadrine  procarbazine  steroid medicines like prednisone or cortisone  stimulant medicines for attention disorders, weight loss, or to stay awake  tamoxifen  theophylline  thiotepa  ticlopidine  tramadol  warfarin This list may not describe all possible interactions. Give your health care provider a list of all the medicines, herbs, non-prescription drugs, or dietary supplements you use. Also tell them if you smoke, drink alcohol, or use illegal drugs. Some items may interact with your medicine. What should I watch for while using this medicine? Tell your doctor if your symptoms do not get better or if they get worse. Visit your doctor or healthcare provider for regular checks on  your progress. Because it may take several weeks to see the full effects of this medicine, it is important to continue your treatment as prescribed by your doctor. This medicine may cause serious skin reactions. They can happen weeks to months after starting the medicine. Contact your healthcare provider right away if you notice fevers or flu-like symptoms with a rash. The rash may be red or purple and then turn into blisters or peeling of the skin. Or, you might notice a red rash  with swelling of the face, lips or lymph nodes in your neck or under your arms. Patients and their families should watch out for new or worsening thoughts of suicide or depression. Also watch out for sudden changes in feelings such as feeling anxious, agitated, panicky, irritable, hostile, aggressive, impulsive, severely restless, overly excited and hyperactive, or not being able to sleep. If this happens, especially at the beginning of treatment or after a change in dose, call your healthcare provider. Avoid alcoholic drinks while taking this medicine. Drinking excessive alcoholic beverages, using sleeping or anxiety medicines, or quickly stopping the use of these agents while taking this medicine may increase your risk for a seizure. Do not drive or use heavy machinery until you know how this medicine affects you. This medicine can impair your ability to perform these tasks. Do not take this medicine close to bedtime. It may prevent you from sleeping. Your mouth may get dry. Chewing sugarless gum or sucking hard candy, and drinking plenty of water may help. Contact your doctor if the problem does not go away or is severe. What side effects may I notice from receiving this medicine? Side effects that you should report to your doctor or health care professional as soon as possible:  allergic reactions like skin rash, itching or hives, swelling of the face, lips, or tongue  breathing problems  changes in  vision  confusion  elevated mood, decreased need for sleep, racing thoughts, impulsive behavior  fast or irregular heartbeat  hallucinations, loss of contact with reality  increased blood pressure  rash, fever, and swollen lymph nodes  redness, blistering, peeling, or loosening of the skin, including inside the mouth  seizures  suicidal thoughts or other mood changes  unusually weak or tired  vomiting Side effects that usually do not require medical attention (report to your doctor or health care professional if they continue or are bothersome):  constipation  headache  loss of appetite  nausea  tremors  weight loss This list may not describe all possible side effects. Call your doctor for medical advice about side effects. You may report side effects to FDA at 1-800-FDA-1088. Where should I keep my medicine? Keep out of the reach of children. Store at room temperature between 20 and 25 degrees C (68 and 77 degrees F), away from direct sunlight and moisture. Keep tightly closed. Throw away any unused medicine after the expiration date. NOTE: This sheet is a summary. It may not cover all possible information. If you have questions about this medicine, talk to your doctor, pharmacist, or health care provider.  2020 Elsevier/Gold Standard (2019-03-05 14:02:47) Pantoprazole tablets What is this medicine? PANTOPRAZOLE (pan TOE pra zole) prevents the production of acid in the stomach. It is used to treat gastroesophageal reflux disease (GERD), inflammation of the esophagus, and Zollinger-Ellison syndrome. This medicine may be used for other purposes; ask your health care provider or pharmacist if you have questions. COMMON BRAND NAME(S): Protonix What should I tell my health care provider before I take this medicine? They need to know if you have any of these conditions:  liver disease  low levels of magnesium in the blood  lupus  an unusual or allergic reaction to  omeprazole, lansoprazole, pantoprazole, rabeprazole, other medicines, foods, dyes, or preservatives  pregnant or trying to get pregnant  breast-feeding How should I use this medicine? Take this medicine by mouth. Swallow the tablets whole with a drink of water. Follow the directions on the prescription label. Do not  crush, break, or chew. Take your medicine at regular intervals. Do not take your medicine more often than directed. Talk to your pediatrician regarding the use of this medicine in children. While this drug may be prescribed for children as young as 5 years for selected conditions, precautions do apply. Overdosage: If you think you have taken too much of this medicine contact a poison control center or emergency room at once. NOTE: This medicine is only for you. Do not share this medicine with others. What if I miss a dose? If you miss a dose, take it as soon as you can. If it is almost time for your next dose, take only that dose. Do not take double or extra doses. What may interact with this medicine? Do not take this medicine with any of the following medications:  atazanavir  nelfinavir This medicine may also interact with the following medications:  ampicillin  delavirdine  erlotinib  iron salts  medicines for fungal infections like ketoconazole, itraconazole and voriconazole  methotrexate  mycophenolate mofetil  warfarin This list may not describe all possible interactions. Give your health care provider a list of all the medicines, herbs, non-prescription drugs, or dietary supplements you use. Also tell them if you smoke, drink alcohol, or use illegal drugs. Some items may interact with your medicine. What should I watch for while using this medicine? It can take several days before your stomach pain gets better. Check with your doctor or health care provider if your condition does not start to get better, or if it gets worse. This medicine may cause serious  skin reactions. They can happen weeks to months after starting the medicine. Contact your health care provider right away if you notice fevers or flu-like symptoms with a rash. The rash may be red or purple and then turn into blisters or peeling of the skin. Or, you might notice a red rash with swelling of the face, lips or lymph nodes in your neck or under your arms. You may need blood work done while you are taking this medicine. This medicine may cause a decrease in vitamin B12. You should make sure that you get enough vitamin B12 while you are taking this medicine. Discuss the foods you eat and the vitamins you take with your health care provider. What side effects may I notice from receiving this medicine? Side effects that you should report to your doctor or health care professional as soon as possible:   allergic reactions like skin rash, itching or hives, swelling of the face, lips, or tongue   bone, muscle or joint pain   breathing problems   chest pain or chest tightness   dark yellow or brown urine   dizziness   fast, irregular heartbeat   feeling faint or lightheaded   fever or sore throat   muscle spasm   palpitations   rash on cheeks or arms that gets worse in the sun   redness, blistering, peeling or loosening of the skin, including inside the mouth   seizures  stomach polyps   tremors   unusual bleeding or bruising   unusually weak or tired   yellowing of the eyes or skin Side effects that usually do not require medical attention (report to your doctor or health care professional if they continue or are bothersome):   constipation   diarrhea   dry mouth   headache   nausea This list may not describe all possible side effects. Call your doctor for medical  advice about side effects. You may report side effects to FDA at 1-800-FDA-1088. Where should I keep my medicine? Keep out of the reach of children. Store at room  temperature between 15 and 30 degrees C (59 and 86 degrees F). Protect from light and moisture. Throw away any unused medicine after the expiration date. NOTE: This sheet is a summary. It may not cover all possible information. If you have questions about this medicine, talk to your doctor, pharmacist, or health care provider.  2020 Elsevier/Gold Standard (2019-03-20 13:18:32) Ondansetron tablets What is this medicine? ONDANSETRON (on DAN se tron) is used to treat nausea and vomiting caused by chemotherapy. It is also used to prevent or treat nausea and vomiting after surgery. This medicine may be used for other purposes; ask your health care provider or pharmacist if you have questions. COMMON BRAND NAME(S): Zofran What should I tell my health care provider before I take this medicine? They need to know if you have any of these conditions:  heart disease  history of irregular heartbeat  liver disease  low levels of magnesium or potassium in the blood  an unusual or allergic reaction to ondansetron, granisetron, other medicines, foods, dyes, or preservatives  pregnant or trying to get pregnant  breast-feeding How should I use this medicine? Take this medicine by mouth with a glass of water. Follow the directions on your prescription label. Take your doses at regular intervals. Do not take your medicine more often than directed. Talk to your pediatrician regarding the use of this medicine in children. Special care may be needed. Overdosage: If you think you have taken too much of this medicine contact a poison control center or emergency room at once. NOTE: This medicine is only for you. Do not share this medicine with others. What if I miss a dose? If you miss a dose, take it as soon as you can. If it is almost time for your next dose, take only that dose. Do not take double or extra doses. What may interact with this medicine? Do not take this medicine with any of the following  medications:  apomorphine  certain medicines for fungal infections like fluconazole, itraconazole, ketoconazole, posaconazole, voriconazole  cisapride  dronedarone  pimozide  thioridazine This medicine may also interact with the following medications:  carbamazepine  certain medicines for depression, anxiety, or psychotic disturbances  fentanyl  linezolid  MAOIs like Carbex, Eldepryl, Marplan, Nardil, and Parnate  methylene blue (injected into a vein)  other medicines that prolong the QT interval (cause an abnormal heart rhythm) like dofetilide, ziprasidone  phenytoin  rifampicin  tramadol This list may not describe all possible interactions. Give your health care provider a list of all the medicines, herbs, non-prescription drugs, or dietary supplements you use. Also tell them if you smoke, drink alcohol, or use illegal drugs. Some items may interact with your medicine. What should I watch for while using this medicine? Check with your doctor or health care professional right away if you have any sign of an allergic reaction. What side effects may I notice from receiving this medicine? Side effects that you should report to your doctor or health care professional as soon as possible:  allergic reactions like skin rash, itching or hives, swelling of the face, lips or tongue  breathing problems  confusion  dizziness  fast or irregular heartbeat  feeling faint or lightheaded, falls  fever and chills  loss of balance or coordination  seizures  sweating  swelling of  the hands or feet  tightness in the chest  tremors  unusually weak or tired Side effects that usually do not require medical attention (report to your doctor or health care professional if they continue or are bothersome):  constipation or diarrhea  headache This list may not describe all possible side effects. Call your doctor for medical advice about side effects. You may report side  effects to FDA at 1-800-FDA-1088. Where should I keep my medicine? Keep out of the reach of children. Store between 2 and 30 degrees C (36 and 86 degrees F). Throw away any unused medicine after the expiration date. NOTE: This sheet is a summary. It may not cover all possible information. If you have questions about this medicine, talk to your doctor, pharmacist, or health care provider.  2020 Elsevier/Gold Standard (2018-12-02 07:16:43)   Fat and Cholesterol Restricted Eating Plan Getting too much fat and cholesterol in your diet may cause health problems. Choosing the right foods helps keep your fat and cholesterol at normal levels. This can keep you from getting certain diseases. Your doctor may recommend an eating plan that includes:  Total fat: ______% or less of total calories a day.  Saturated fat: ______% or less of total calories a day.  Cholesterol: less than _________mg a day.  Fiber: ______g a day. What are tips for following this plan? Meal planning  At meals, divide your plate into four equal parts: ? Fill one-half of your plate with vegetables and green salads. ? Fill one-fourth of your plate with whole grains. ? Fill one-fourth of your plate with low-fat (lean) protein foods.  Eat fish that is high in omega-3 fats at least two times a week. This includes mackerel, tuna, sardines, and salmon.  Eat foods that are high in fiber, such as whole grains, beans, apples, broccoli, carrots, peas, and barley. General tips   Work with your doctor to lose weight if you need to.  Avoid: ? Foods with added sugar. ? Fried foods. ? Foods with partially hydrogenated oils.  Limit alcohol intake to no more than 1 drink a day for nonpregnant women and 2 drinks a day for men. One drink equals 12 oz of beer, 5 oz of wine, or 1 oz of hard liquor. Reading food labels  Check food labels for: ? Trans fats. ? Partially hydrogenated oils. ? Saturated fat (g) in each  serving. ? Cholesterol (mg) in each serving. ? Fiber (g) in each serving.  Choose foods with healthy fats, such as: ? Monounsaturated fats. ? Polyunsaturated fats. ? Omega-3 fats.  Choose grain products that have whole grains. Look for the word "whole" as the first word in the ingredient list. Cooking  Cook foods using low-fat methods. These include baking, boiling, grilling, and broiling.  Eat more home-cooked foods. Eat at restaurants and buffets less often.  Avoid cooking using saturated fats, such as butter, cream, palm oil, palm kernel oil, and coconut oil. Recommended foods  Fruits  All fresh, canned (in natural juice), or frozen fruits. Vegetables  Fresh or frozen vegetables (raw, steamed, roasted, or grilled). Green salads. Grains  Whole grains, such as whole wheat or whole grain breads, crackers, cereals, and pasta. Unsweetened oatmeal, bulgur, barley, quinoa, or brown rice. Corn or whole wheat flour tortillas. Meats and other protein foods  Ground beef (85% or leaner), grass-fed beef, or beef trimmed of fat. Skinless chicken or [place]. Ground chicken or [place]. Pork trimmed of fat. All fish and seafood. Egg whites. Dried beans,  peas, or lentils. Unsalted nuts or seeds. Unsalted canned beans. Nut butters without added sugar or oil. Dairy  Low-fat or nonfat dairy products, such as skim or 1% milk, 2% or reduced-fat cheeses, low-fat and fat-free ricotta or cottage cheese, or plain low-fat and nonfat yogurt. Fats and oils  Tub margarine without trans fats. Light or reduced-fat mayonnaise and salad dressings. Avocado. Olive, canola, sesame, or safflower oils. The items listed above may not be a complete list of foods and beverages you can eat. Contact a dietitian for more information. Foods to avoid Fruits  Canned fruit in heavy syrup. Fruit in cream or butter sauce. Fried fruit. Vegetables  Vegetables cooked in cheese, cream, or butter sauce. Fried  vegetables. Grains  White bread. White pasta. White rice. Cornbread. Bagels, pastries, and croissants. Crackers and snack foods that contain trans fat and hydrogenated oils. Meats and other protein foods  Fatty cuts of meat. Ribs, chicken wings, bacon, sausage, bologna, salami, chitterlings, fatback, hot dogs, bratwurst, and packaged lunch meats. Liver and organ meats. Whole eggs and egg yolks. Chicken and [place] with skin. Fried meat. Dairy  Whole or 2% milk, cream, half-and-half, and cream cheese. Whole milk cheeses. Whole-fat or sweetened yogurt. Full-fat cheeses. Nondairy creamers and whipped toppings. Processed cheese, cheese spreads, and cheese curds. Beverages  Alcohol. Sugar-sweetened drinks such as sodas, lemonade, and fruit drinks. Fats and oils  Butter, stick margarine, lard, shortening, ghee, or bacon fat. Coconut, palm kernel, and palm oils. Sweets and desserts  Corn syrup, sugars, honey, and molasses. Candy. Jam and jelly. Syrup. Sweetened cereals. Cookies, pies, cakes, donuts, muffins, and ice cream. The items listed above may not be a complete list of foods and beverages you should avoid. Contact a dietitian for more information. Summary  Choosing the right foods helps keep your fat and cholesterol at normal levels. This can keep you from getting certain diseases.  At meals, fill one-half of your plate with vegetables and green salads.  Eat high-fiber foods, like whole grains, beans, apples, carrots, peas, and barley.  Limit added sugar, saturated fats, alcohol, and fried foods. This information is not intended to replace advice given to you by your health care provider. Make sure you discuss any questions you have with your health care provider. Document Revised: 08/13/2018 Document Reviewed: 08/27/2017 Elsevier Patient Education  2020 [employer].

## 2020-04-27 NOTE — Progress Notes (Signed)
Normal chest x-ray.

## 2020-04-28 ENCOUNTER — Other Ambulatory Visit: Payer: Self-pay | Admitting: Adult Health

## 2020-04-28 ENCOUNTER — Encounter: Payer: Self-pay | Admitting: Adult Health

## 2020-04-28 ENCOUNTER — Telehealth: Payer: Self-pay

## 2020-04-28 DIAGNOSIS — F1410 Cocaine abuse, uncomplicated: Secondary | ICD-10-CM

## 2020-04-28 DIAGNOSIS — E039 Hypothyroidism, unspecified: Secondary | ICD-10-CM

## 2020-04-28 DIAGNOSIS — E559 Vitamin D deficiency, unspecified: Secondary | ICD-10-CM

## 2020-04-28 DIAGNOSIS — F1290 Cannabis use, unspecified, uncomplicated: Secondary | ICD-10-CM

## 2020-04-28 LAB — LIPID PANEL
Chol/HDL Ratio: 5.2 ratio — ABNORMAL HIGH (ref 0.0–4.4)
Cholesterol, Total: 257 mg/dL — ABNORMAL HIGH (ref 100–199)
HDL: 49 mg/dL (ref >39)
LDL Chol Calc (NIH): 194 mg/dL — ABNORMAL HIGH (ref 0–99)
Triglycerides: 80 mg/dL (ref 0–149)
VLDL Cholesterol Cal: 14 mg/dL (ref 5–40)

## 2020-04-28 LAB — T4 AND TSH
T4, Total: 1.4 ug/dL — CL (ref 4.5–12.0)
TSH: 24.5 u[IU]/mL — ABNORMAL HIGH (ref 0.450–4.500)

## 2020-04-28 LAB — COMPREHENSIVE METABOLIC PANEL
ALT: 12 IU/L (ref 0–32)
AST: 28 IU/L (ref 0–40)
Albumin/Globulin Ratio: 1.3 (ref 1.2–2.2)
Albumin: 4.5 g/dL (ref 3.8–4.8)
Alkaline Phosphatase: 49 IU/L (ref 39–117)
BUN/Creatinine Ratio: 9 (ref 9–23)
BUN: 8 mg/dL (ref 6–20)
Bilirubin Total: 0.3 mg/dL (ref 0.0–1.2)
CO2: 20 mmol/L (ref 20–29)
Calcium: 9.4 mg/dL (ref 8.7–10.2)
Chloride: 103 mmol/L (ref 96–106)
Creatinine, Ser: 0.85 mg/dL (ref 0.57–1.00)
GFR calc Af Amer: 101 mL/min/{1.73_m2} (ref >59)
GFR calc non Af Amer: 88 mL/min/{1.73_m2} (ref >59)
Globulin, Total: 3.5 g/dL (ref 1.5–4.5)
Glucose: 92 mg/dL (ref 65–99)
Potassium: 4 mmol/L (ref 3.5–5.2)
Sodium: 139 mmol/L (ref 134–144)
Total Protein: 8 g/dL (ref 6.0–8.5)

## 2020-04-28 LAB — PAIN MGT SCRN (14 DRUGS), UR
Amphetamine Scrn, Ur: NEGATIVE ng/mL
BARBITURATE SCREEN URINE: NEGATIVE ng/mL
BENZODIAZEPINE SCREEN, URINE: NEGATIVE ng/mL
Buprenorphine, Urine: NEGATIVE ng/mL
CANNABINOIDS UR QL SCN: POSITIVE ng/mL — AB
Cocaine (Metab) Scrn, Ur: POSITIVE ng/mL — AB
Creatinine(Crt), U: 57.1 mg/dL (ref 20.0–300.0)
Fentanyl, Urine: NEGATIVE pg/mL
Meperidine Screen, Urine: NEGATIVE ng/mL
Methadone Screen, Urine: NEGATIVE ng/mL
OXYCODONE+OXYMORPHONE UR QL SCN: NEGATIVE ng/mL
Opiate Scrn, Ur: NEGATIVE ng/mL
Ph of Urine: 7.5 (ref 4.5–8.9)
Phencyclidine Qn, Ur: NEGATIVE ng/mL
Propoxyphene Scrn, Ur: NEGATIVE ng/mL
Tramadol Screen, Urine: NEGATIVE ng/mL

## 2020-04-28 LAB — CBC WITH DIFFERENTIAL/PLATELET
Basophils Absolute: 0.1 10*3/uL (ref 0.0–0.2)
Basos: 1 %
EOS (ABSOLUTE): 0.1 10*3/uL (ref 0.0–0.4)
Eos: 1 %
Hematocrit: 38.9 % (ref 34.0–46.6)
Hemoglobin: 12.3 g/dL (ref 11.1–15.9)
Immature Grans (Abs): 0 10*3/uL (ref 0.0–0.1)
Immature Granulocytes: 0 %
Lymphocytes Absolute: 1.8 10*3/uL (ref 0.7–3.1)
Lymphs: 22 %
MCH: 27.5 pg (ref 26.6–33.0)
MCHC: 31.6 g/dL (ref 31.5–35.7)
MCV: 87 fL (ref 79–97)
Monocytes Absolute: 0.5 10*3/uL (ref 0.1–0.9)
Monocytes: 5 %
Neutrophils Absolute: 5.9 10*3/uL (ref 1.4–7.0)
Neutrophils: 71 %
Platelets: 335 10*3/uL (ref 150–450)
RBC: 4.47 x10E6/uL (ref 3.77–5.28)
RDW: 15.1 % (ref 11.7–15.4)
WBC: 8.3 10*3/uL (ref 3.4–10.8)

## 2020-04-28 LAB — HEPATITIS PANEL, ACUTE
Hep A IgM: NEGATIVE
Hep B C IgM: NEGATIVE
Hep C Virus Ab: <0.1 s/co ratio (ref 0.0–0.9)
Hepatitis B Surface Ag: NEGATIVE

## 2020-04-28 LAB — VITAMIN D 25 HYDROXY (VIT D DEFICIENCY, FRACTURES): Vit D, 25-Hydroxy: 7.1 ng/mL — ABNORMAL LOW (ref 30.0–100.0)

## 2020-04-28 LAB — HIV ANTIBODY (ROUTINE TESTING W REFLEX): HIV Screen 4th Generation wRfx: NONREACTIVE

## 2020-04-28 LAB — RPR: RPR Ser Ql: NONREACTIVE

## 2020-04-28 LAB — VITAMIN B12: Vitamin B-12: 416 pg/mL (ref 232–1245)

## 2020-04-28 MED ORDER — VITAMIN D (ERGOCALCIFEROL) 1.25 MG (50000 UNIT) PO CAPS: 50000.0000 [IU] | ORAL_CAPSULE | ORAL | 0 refills | Status: DC

## 2020-04-28 MED ORDER — LEVOTHYROXINE SODIUM 50 MCG PO TABS: 50.0000 ug | ORAL_TABLET | Freq: Every day | ORAL | 0 refills | Status: DC

## 2020-04-28 NOTE — Telephone Encounter (Signed)
Patient has viewed results on my chart and called office requesting prescription be sent to [facility]. I called patient to discuss endocrinology referral she asks that she stays in [place]. KW

## 2020-04-28 NOTE — Progress Notes (Signed)
Positive for cannabis as patient did report.  Also positive for cocaine. Not reported.  Recommend discontinuation of both.

## 2020-04-28 NOTE — Telephone Encounter (Signed)
-----   Message from Berniece Pap, FNP sent at 04/28/2020  3:44 PM EDT ----- CBC is within normal limits no infection or signs of anemia.  CMP within normal limits- kidney and liver function as well as electrolytes normal.  Total cholesterol and LDL elevated.  Discuss lifestyle modification with patient e.g. increase exercise, fiber, fruits, vegetables, lean meat, and omega 3/fish intake and decrease saturated fat.  If patient following strict diet and exercise program already please schedule follow up appointment with primary care physician  Acute hepatitis panel negative for A, B and C. RPR for syphilis is negative. HIV is non reactive/ negative. Vitamin B12 is within normal limits.   Thyroid is 24.5, T4 is 1.4 and he is hypothyroid.  I want her to begin Synthroid 50 mcg by mouth once daily since she has not taken it in over a year. Let me know where she wants  medication sent. Recheck in 3 months. Given her extensive thyroid history I do recommend endocrinology referral - does she want [place] or go back to [facility] [place] I believe she said [place] but just clarify.   Vitamin D is very low, 7.1, she will need prescription Vitamin D at 50,000 international units take ONE tablet by mouth ONCE weekly for 12 weeks.   Recheck Vitamin D level and TSH in 3 months- please place lab order.   Keep follow up appointment for PAP/ follow up on anxiety. She should hear from [facility] within 2 weeks and if she has not please let us know.

## 2020-04-28 NOTE — Telephone Encounter (Signed)
Medications sent.  Referral was placed. Call if not heard within two weeks.

## 2020-04-28 NOTE — Progress Notes (Signed)
CBC is within normal limits no infection or signs of anemia.  CMP within normal limits- kidney and liver function as well as electrolytes normal.  Total cholesterol and LDL elevated.  Discuss lifestyle modification with patient e.g. increase exercise, fiber, fruits, vegetables, lean meat, and omega 3/fish intake and decrease saturated fat.  If patient following strict diet and exercise program already please schedule follow up appointment with primary care physician  Acute hepatitis panel negative for A, B and C. RPR for syphilis is negative. HIV is non reactive/ negative. Vitamin B12 is within normal limits.   Thyroid is 24.5, T4 is 1.4 and he is hypothyroid.  I want her to begin Synthroid 50 mcg by mouth once daily since she has not taken it in over a year. Let me know where she wants  medication sent. Recheck in 3 months. Given her extensive thyroid history I do recommend endocrinology referral - does she want [place] or go back to [facility] I believe she said  but just clarify.   Vitamin D is very low, 7.1, she will need prescription Vitamin D at 50,000 international units take ONE tablet by mouth ONCE weekly for 12 weeks.   Recheck Vitamin D level and TSH in 3 months- please place lab order.   Keep follow up appointment for PAP/ follow up on anxiety. She should hear from [facility] within 2 weeks and if she has not please let us know.

## 2020-04-29 ENCOUNTER — Encounter: Payer: Self-pay | Admitting: Adult Health

## 2020-05-05 ENCOUNTER — Encounter: Payer: Self-pay | Admitting: Adult Health

## 2020-05-05 ENCOUNTER — Telehealth (INDEPENDENT_AMBULATORY_CARE_PROVIDER_SITE_OTHER): Payer: Medicaid Other | Admitting: Adult Health

## 2020-05-05 ENCOUNTER — Ambulatory Visit
Admission: RE | Admit: 2020-05-05 | Discharge: 2020-05-05 | Disposition: A | Discharge status: Home / Self Care | Payer: Medicaid Other | Source: Ambulatory Visit | Attending: Adult Health | Admitting: Adult Health

## 2020-05-05 ENCOUNTER — Other Ambulatory Visit: Payer: Self-pay

## 2020-05-05 DIAGNOSIS — R110 Nausea: Secondary | ICD-10-CM | POA: Diagnosis present

## 2020-05-05 DIAGNOSIS — F1910 Other psychoactive substance abuse, uncomplicated: Secondary | ICD-10-CM

## 2020-05-05 DIAGNOSIS — R195 Other fecal abnormalities: Secondary | ICD-10-CM | POA: Diagnosis not present

## 2020-05-05 DIAGNOSIS — E039 Hypothyroidism, unspecified: Secondary | ICD-10-CM

## 2020-05-05 DIAGNOSIS — J302 Other seasonal allergic rhinitis: Secondary | ICD-10-CM | POA: Diagnosis not present

## 2020-05-05 DIAGNOSIS — F410 Panic disorder [episodic paroxysmal anxiety] without agoraphobia: Secondary | ICD-10-CM

## 2020-05-05 DIAGNOSIS — R0602 Shortness of breath: Secondary | ICD-10-CM | POA: Diagnosis not present

## 2020-05-05 MED ORDER — HYDROXYZINE HCL 50 MG PO TABS: 50.0000 mg | ORAL_TABLET | Freq: Three times a day (TID) | ORAL | 0 refills | Status: DC | PRN

## 2020-05-05 MED ORDER — FLUTICASONE PROPIONATE 50 MCG/ACT NA SUSP: 1.0000 | Freq: Every day | NASAL | 2 refills | Status: DC

## 2020-05-05 MED ORDER — CETIRIZINE HCL 10 MG PO TABS: 10.0000 mg | ORAL_TABLET | Freq: Every day | ORAL | 2 refills | Status: DC

## 2020-05-05 MED ORDER — PSYLLIUM 95 % PO PACK: 1.0000 | PACK | Freq: Every day | ORAL | 0 refills | Status: DC

## 2020-05-05 NOTE — Progress Notes (Signed)
MyChart Video Visit    Virtual Visit via Video Note   This visit type was conducted due to national recommendations for restrictions regarding the COVID-19 Pandemic (e.g. social distancing) in an effort to limit this patient's exposure and mitigate transmission in our community. This patient is at least at moderate risk for complications without adequate follow up. This format is felt to be most appropriate for this patient at this time. Physical exam was limited by quality of the video and audio technology used for the visit.   Patient location: at home  Provider location: Provider: Provider's office at  [facility], [place].      Patient: Pamela Mooney   DOB: 1981/09/15   39 y.o. Female  MRN: [id number] Visit Date: 05/05/2020  Today's healthcare provider: Jairo Ben, FNP   Chief Complaint  Patient presents with  . Shortness of Breath   Subjective    Anxiety Presents for follow-up visit. Symptoms include decreased concentration, depressed mood, hyperventilation, insomnia, irritability, nausea, palpitations, panic, restlessness and shortness of breath (occasional ). Patient reports no chest pain, compulsions, confusion, dizziness, dry mouth, excessive worry, feeling of choking, impotence, malaise, muscle tension, nervous/anxious behavior, obsessions or suicidal ideas. Symptoms occur most days. The severity of symptoms is moderate.     Non smoker since age 39 years of age just smoked cigarettes occasionally. Positive on drug test for admitted marijuana, she also tested postive for cocaine and today reports " I have not used in a long time".   Denies vomiting.  Bowel movements are irregular, had dark stools " all the time have been for years " Denies any iron use. She denies any pain in abdomen. She has no pain with passing bowel movement.   Stays nauseated.  She is still taking Protonix she started 04/27/2020.  She started her thyroid  medication last week, and is taking on empty stomach, severe hypothyroidism diagnosed, was not taking her medication for over a year.   She denies any ill exposure or any signs of infection.   She is tearful on the phone.  She denies any distress.  She is able to walk in her house without difficulty. She has no vital signs available. She repots skin is normal color.   She has no loss of taste or smell.   History of panic attacks. She has her husband at home for support.   She felt as if she was possibly wheezing last pm. She has some nasal allergies seasonally. Not using any medications. She reports she was very anxious when she was " wheezing "  She denies any chance of pregnancy.   Patient  denies any fever, body aches,chills, rash, chest pain,  vomiting, or diarrhea.  Denies dizziness, lightheadedness, pre syncopal or syncopal episodes.    She denies any suicidal or homicidal ideations or intents. Denies leg pain, recent flights, erythema, or dyspnea with exertion. No recent surgeries. She has no other concerns by phone today.   Patient Active Problem List   Diagnosis Date Noted  . Blood pressure elevated without history of HTN 04/27/2020  . Screening for lipid disorders 04/27/2020  . Marijuana use 04/27/2020  . Nausea 04/27/2020  . Fatigue 04/27/2020  . Wheezing 04/27/2020  . Hashimoto's thyroiditis 04/27/2020  . Anxiety 04/27/2020  . Body mass index 29.0-29.9, adult 04/27/2020  . MDD (major depressive disorder), recurrent episode, moderate (HCC) 08/19/2019  . Other specified hypothyroidism 07/14/2018   Past Medical History:  Diagnosis Date  .  Thyroid disease    No Known Allergies    Medications: Outpatient Medications Prior to Visit  Medication Sig  . acetaminophen (TYLENOL) 325 MG tablet Take by mouth.  Marland Kitchen buPROPion (WELLBUTRIN SR) 150 MG 12 hr tablet Take 1 tablet (150 mg total) by mouth 2 (two) times daily. Start 150 mg daily for one week and then can increase as  above.  . levothyroxine (SYNTHROID) 50 MCG tablet Take 1 tablet (50 mcg total) by mouth daily before breakfast.  . ondansetron (ZOFRAN) 4 MG tablet Take 1 tablet (4 mg total) by mouth every 8 (eight) hours as needed for nausea or vomiting.  . pantoprazole (PROTONIX) 40 MG tablet Take 1 tablet (40 mg total) by mouth daily.  . Vitamin D, Ergocalciferol, (DRISDOL) 1.25 MG (50000 UNIT) CAPS capsule Take 1 capsule (50,000 Units total) by mouth every 7 (seven) days. (taking one tablet per week)  . escitalopram (LEXAPRO) 10 MG tablet Take 1 tablet (10 mg total) by mouth daily.   No facility-administered medications prior to visit.    Last CBC Lab Results  Component Value Date   WBC 8.3 04/27/2020   HGB 12.3 04/27/2020   HCT 38.9 04/27/2020   MCV 87 04/27/2020   MCH 27.5 04/27/2020   RDW 15.1 04/27/2020   PLT 335 04/27/2020      Review of Systems  Constitutional: Positive for irritability.  Respiratory: Positive for shortness of breath (occasional ).   Cardiovascular: Positive for palpitations. Negative for chest pain.  Gastrointestinal: Positive for nausea.  Genitourinary: Negative for impotence.  Neurological: Negative for dizziness.  Psychiatric/Behavioral: Positive for decreased concentration. Negative for confusion and suicidal ideas. The patient has insomnia. The patient is not nervous/anxious.     Last CBC Lab Results  Component Value Date   WBC 8.3 04/27/2020   HGB 12.3 04/27/2020   HCT 38.9 04/27/2020   MCV 87 04/27/2020   MCH 27.5 04/27/2020   RDW 15.1 04/27/2020   PLT 335 04/27/2020   Last metabolic panel Lab Results  Component Value Date   GLUCOSE 92 04/27/2020   NA 139 04/27/2020   K 4.0 04/27/2020   CL 103 04/27/2020   CO2 20 04/27/2020   BUN 8 04/27/2020   CREATININE 0.85 04/27/2020   GFRNONAA 88 04/27/2020   GFRAA 101 04/27/2020   CALCIUM 9.4 04/27/2020   PROT 8.0 04/27/2020   ALBUMIN 4.5 04/27/2020   LABGLOB 3.5 04/27/2020   AGRATIO 1.3 04/27/2020     BILITOT 0.3 04/27/2020   ALKPHOS 49 04/27/2020   AST 28 04/27/2020   ALT 12 04/27/2020   ANIONGAP 8 08/19/2019   Last lipids Lab Results  Component Value Date   CHOL 257 (H) 04/27/2020   HDL 49 04/27/2020   LDLCALC 194 (H) 04/27/2020   TRIG 80 04/27/2020   CHOLHDL 5.2 (H) 04/27/2020   Last hemoglobin A1c No results found for: HGBA1C Last thyroid functions Lab Results  Component Value Date   TSH 24.500 (H) 04/27/2020   T4TOTAL 1.4 (LL) 04/27/2020   Last vitamin D Lab Results  Component Value Date   VD25OH 7.1 (L) 04/27/2020   Last vitamin B12 and Folate Lab Results  Component Value Date   VITAMINB12 416 04/27/2020      Objective    There were no vitals taken for this visit. Telephone call only - no video capability on patients end.  SpO2 Readings from Last 3 Encounters:  04/27/20 98%  04/05/20 100%  08/19/19 98%      Physical  Exam   She is very tearful on phone as she was in office.   Patient is alert and oriented and responsive to questions Engages in conversation with provider. Speaks in full sentences without any pauses without any shortness of breath or distress.     Assessment & Plan     Panic attack as reaction to stress  Shortness of breath - Plan: DG Chest 2 View  Nausea - Plan: DG Chest 2 View, Ambulatory referral to Gastroenterology  Chronic constipation - Plan: Ambulatory referral to Gastroenterology  Dark stools - Plan: Ambulatory referral to Gastroenterology  Other specified hypothyroidism - Plan: Ambulatory referral to Gastroenterology  Seasonal allergies - Plan: fluticasone (FLONASE) 50 MCG/ACT nasal spray, cetirizine (ZYRTEC) 10 MG tablet   Meds ordered this encounter  Medications  . hydrOXYzine (ATARAX/VISTARIL) 50 MG tablet    Sig: Take 1 tablet (50 mg total) by mouth every 8 (eight) hours as needed for anxiety or nausea (WILL CAUSE DROWSINESS, DO NOT DRIVE, NO ALCOHOL or other sedatives, NO LEGAL DECISIONS.).    Dispense:   30 tablet    Refill:  0  . psyllium (HYDROCIL/METAMUCIL) 95 % PACK    Sig: Take 1 packet by mouth daily. Mix with a full 8 ounces of water stir and drink for constipation.    Dispense:  30 each    Refill:  0  . fluticasone (FLONASE) 50 MCG/ACT nasal spray    Sig: Place 1 spray into both nostrils daily. For seasonal allergies    Dispense:  16 g    Refill:  2  . cetirizine (ZYRTEC) 10 MG tablet    Sig: Take 1 tablet (10 mg total) by mouth daily. For seasonal allergies    Dispense:  30 tablet    Refill:  2    Orders Placed This Encounter  Procedures  . DG Chest 2 View    Order Specific Question:   Reason for Exam (SYMPTOM  OR DIAGNOSIS REQUIRED)    Answer:   shortness of breath.    Order Specific Question:   Is patient pregnant?    Answer:   No    Comments:   LMP 04/05/2020     Order Specific Question:   Preferred imaging location?    Answer:   [facility]    Order Specific Question:   Radiology Contrast Protocol - do NOT remove file path    Answer:   [email or web address]  . Ambulatory referral to Gastroenterology    Referral Priority:   Routine    Referral Type:   Consultation    Referral Reason:   Specialty Services Required    Number of Visits Requested:   1    Allergy medications as above.    She will go for baseline chest x ray. Suspect is from uncontrolled thyroid and panic attacks/ anxiety, history of substance abuse she see's [facility] she was referred to at last visit on tomorrow 05/06/2020. She is to advise them of all drug use, medications she is taking and how she is feeling.   Suspect constipation is from hypothyroidism not controlled however since chronic and dark stools present she reports without active red bleeding, will have GI consult - also chronic nausea possibly related to anxiety and thyroid untreated as well. She is now back on Synthroid.   Recommend treatment centers for help with substance  abuse discontinuation.  Will try Atarax for nausea and anxiety  as above, advise no drugs or sedatives with it and will cause  drowsiness and caution advised.    An After Visit Summary was sent to [facility] and given to the patient.  Advised patient call the office or your primary care doctor for an appointment if no improvement within 72 hours or if any symptoms change or worsen at any time  Advised ER or urgent Care if after hours or on weekend. Call 911 for emergency symptoms at any time.Patinet verbalized understanding of all instructions given/reviewed and treatment plan and has no further questions or concerns at this time.       I discussed the assessment and treatment plan with the patient. The patient was provided an opportunity to ask questions and all were answered. The patient agreed with the plan and demonstrated an understanding of the instructions.   The patient was advised to call back or seek an in-person evaluation if the symptoms worsen or if the condition fails to improve as anticipated.  I provided 35  minutes of non-face-to-face time during this encounter.  IBeverely Pace Flinchum, FNP, have reviewed all documentation for this visit. The documentation on 05/05/20 for the exam, diagnosis, procedures, and orders are all accurate and complete.  Jairo Ben, FNP [facility] [phone] (phone) [phone] (fax)  [facility]

## 2020-05-05 NOTE — Progress Notes (Signed)
Mild peribronchial thickening likely from allergy flare with seasonal allergies. Start Zyrtec and Flonase as directed - it was sent to pharmacy.  If symptoms persist she should covid test. Mucinex over the counter per package instructions for any chest congestion that occurs.  We can repeat chest x ray in 3 months to be sure no further change and sooner if worsens.

## 2020-05-05 NOTE — Patient Instructions (Signed)
Meds ordered this encounter  Medications  . hydrOXYzine (ATARAX/VISTARIL) 50 MG tablet    Sig: Take 1 tablet (50 mg total) by mouth every 8 (eight) hours as needed for anxiety or nausea (WILL CAUSE DROWSINESS, DO NOT DRIVE, NO ALCOHOL or other sedatives, NO LEGAL DECISIONS.).    Dispense:  30 tablet    Refill:  0  . psyllium (HYDROCIL/METAMUCIL) 95 % PACK    Sig: Take 1 packet by mouth daily. Mix with a full 8 ounces of water stir and drink for constipation.    Dispense:  30 each    Refill:  0  . fluticasone (FLONASE) 50 MCG/ACT nasal spray    Sig: Place 1 spray into both nostrils daily. For seasonal allergies    Dispense:  16 g    Refill:  2  . cetirizine (ZYRTEC) 10 MG tablet    Sig: Take 1 tablet (10 mg total) by mouth daily. For seasonal allergies    Dispense:  30 tablet    Refill:  2     Medications Discontinued During This Encounter  Medication Reason  . escitalopram (LEXAPRO) 10 MG tablet Completed Course  . ondansetron (ZOFRAN) 4 MG tablet Completed Course    Orders Placed This Encounter  Procedures  . DG Chest 2 View    Order Specific Question:   Reason for Exam (SYMPTOM  OR DIAGNOSIS REQUIRED)    Answer:   shortness of breath.    Order Specific Question:   Is patient pregnant?    Answer:   No    Comments:   LMP 04/05/2020     Order Specific Question:   Preferred imaging location?    Answer:   [facility]    Order Specific Question:   Radiology Contrast Protocol - do NOT remove file path    Answer:   [email or web address]  . Ambulatory referral to Gastroenterology    Referral Priority:   Routine    Referral Type:   Consultation    Referral Reason:   Specialty Services Required    Number of Visits Requested:   1     Shortness of Breath, Adult Shortness of breath means you have trouble breathing. Shortness of breath could be a sign of a medical problem. Follow these instructions at home:   Watch for any  changes in your symptoms.  Do not use any products that contain nicotine or tobacco, such as cigarettes, e-cigarettes, and chewing tobacco.  Do not smoke. Smoking can cause shortness of breath. If you need help to quit smoking, ask your doctor.  Avoid things that can make it harder to breathe, such as: ? Mold. ? Dust. ? Air pollution. ? Chemical smells. ? Things that can cause allergy symptoms (allergens), if you have allergies.  Keep your living space clean. Use products that help remove mold and dust.  Rest as needed. Slowly return to your normal activities.  Take over-the-counter and prescription medicines only as told by your doctor. This includes oxygen therapy and inhaled medicines.  Keep all follow-up visits as told by your doctor. This is important. Contact a doctor if:  Your condition does not get better as soon as expected.  You have a hard time doing your normal activities, even after you rest.  You have new symptoms. Get help right away if:  Your shortness of breath gets worse.  You have trouble breathing when you are resting.  You feel light-headed or you pass out (faint).  You have a cough that is  not helped by medicines.  You cough up blood.  You have pain with breathing.  You have pain in your chest, arms, shoulders, or belly (abdomen).  You have a fever.  You cannot walk up stairs.  You cannot exercise the way you normally do. These symptoms may represent a serious problem that is an emergency. Do not wait to see if the symptoms will go away. Get medical help right away. Call your local emergency services (911 in the [place] Do not drive yourself to the hospital. Summary  Shortness of breath is when you have trouble breathing enough air. It can be a sign of a medical problem.  Avoid things that make it hard for you to breathe, such as smoking, pollution, mold, and dust.  Watch for any changes in your symptoms. Contact your doctor if you do not  get better or you get worse. This information is not intended to replace advice given to you by your health care provider. Make sure you discuss any questions you have with your health care provider. Document Revised: 05/12/2018 Document Reviewed: 05/12/2018 Elsevier Patient Education  2020 [employer]. Nausea, Adult Nausea is feeling sick to your stomach or feeling that you are about to throw up (vomit). Feeling sick to your stomach is usually not serious, but it may be an early sign of a more serious medical problem. As you feel sicker to your stomach, you may throw up. If you throw up, or if you are not able to drink enough fluids, there is a risk that you may lose too much water in your body (get dehydrated). If you lose too much water in your body, you may:  Feel tired.  Feel thirsty.  Have a dry mouth.  Have cracked lips.  Go pee (urinate) less often. Older adults and people who have other diseases or a weak body defense system (immune system) have a higher risk of losing too much water in the body. The main goals of treating this condition are:  To relieve your nausea.  To ensure your nausea occurs less often.  To prevent throwing up and losing too much fluid. Follow these instructions at home: Watch your symptoms for any changes. Tell your doctor about them. Follow these instructions as told by your doctor. Eating and drinking      Take an ORS (oral rehydration solution). This is a drink that is sold at pharmacies and stores.  Drink clear fluids in small amounts as you are able. These include: ? Water. ? Ice chips. ? Fruit juice that has water added (diluted fruit juice). ? Low-calorie sports drinks.  Eat bland, easy-to-digest foods in small amounts as you are able, such as: ? Bananas. ? Applesauce. ? Rice. ? Low-fat (lean) meats. ? Toast. ? Crackers.  Avoid drinking fluids that have a lot of sugar or caffeine in them. This includes energy drinks, sports  drinks, and soda.  Avoid alcohol.  Avoid spicy or fatty foods. General instructions  Take over-the-counter and prescription medicines only as told by your doctor.  Rest at home while you get better.  Drink enough fluid to keep your pee (urine) pale yellow.  Take slow and deep breaths when you feel sick to your stomach.  Avoid food or things that have strong smells.  Wash your hands often with soap and water. If you cannot use soap and water, use hand sanitizer.  Make sure that all people in your home wash their hands well and often.  Keep all  follow-up visits as told by your doctor. This is important. Contact a doctor if:  You feel sicker to your stomach.  You feel sick to your stomach for more than 2 days.  You throw up.  You are not able to drink fluids without throwing up.  You have new symptoms.  You have a fever.  You have a headache.  You have muscle cramps.  You have a rash.  You have pain while peeing.  You feel light-headed or dizzy. Get help right away if:  You have pain in your chest, neck, arm, or jaw.  You feel very weak or you pass out (faint).  You have throw up that is bright red or looks like coffee grounds.  You have bloody or black poop (stools) or poop that looks like tar.  You have a very bad headache, a stiff neck, or both.  You have very bad pain, cramping, or bloating in your belly (abdomen).  You have trouble breathing or you are breathing very quickly.  Your heart is beating very quickly.  Your skin feels cold and clammy.  You feel confused.  You have signs of losing too much water in your body, such as: ? Dark pee, very little pee, or no pee. ? Cracked lips. ? Dry mouth. ? Sunken eyes. ? Sleepiness. ? Weakness. These symptoms may be an emergency. Do not wait to see if the symptoms will go away. Get medical help right away. Call your local emergency services (911 in the [place] Do not drive yourself to the  hospital. Summary  Nausea is feeling sick to your stomach or feeling that you are about to throw up (vomit).  If you throw up, or if you are not able to drink enough fluids, there is a risk that you may lose too much water in your body (get dehydrated).  Eat and drink what your doctor tells you. Take over-the-counter and prescription medicines only as told by your doctor.  Contact a doctor right away if your symptoms get worse or you have new symptoms.  Keep all follow-up visits as told by your doctor. This is important. This information is not intended to replace advice given to you by your health care provider. Make sure you discuss any questions you have with your health care provider. Document Revised: 05/20/2018 Document Reviewed: 05/20/2018 Elsevier Patient Education  2020 [employer]. Hypothyroidism  Hypothyroidism is when the thyroid gland does not make enough of certain hormones (it is underactive). The thyroid gland is a small gland located in the lower front part of the neck, just in front of the windpipe (trachea). This gland makes hormones that help control how the body uses food for energy (metabolism) as well as how the heart and brain function. These hormones also play a role in keeping your bones strong. When the thyroid is underactive, it produces too little of the hormones thyroxine (T4) and triiodothyronine (T3). What are the causes? This condition may be caused by:  Hashimoto's disease. This is a disease in which the body's disease-fighting system (immune system) attacks the thyroid gland. This is the most common cause.  Viral infections.  Pregnancy.  Certain medicines.  Birth defects.  Past radiation treatments to the head or neck for cancer.  Past treatment with radioactive iodine.  Past exposure to radiation in the environment.  Past surgical removal of part or all of the thyroid.  Problems with a gland in the center of the brain (pituitary  gland).  Lack  of enough iodine in the diet. What increases the risk? You are more likely to develop this condition if:  You are female.  You have a family history of thyroid conditions.  You use a medicine called lithium.  You take medicines that affect the immune system (immunosuppressants). What are the signs or symptoms? Symptoms of this condition include:  Feeling as though you have no energy (lethargy).  Not being able to tolerate cold.  Weight gain that is not explained by a change in diet or exercise habits.  Lack of appetite.  Dry skin.  Coarse hair.  Menstrual irregularity.  Slowing of thought processes.  Constipation.  Sadness or depression. How is this diagnosed? This condition may be diagnosed based on:  Your symptoms, your medical history, and a physical exam.  Blood tests. You may also have imaging tests, such as an ultrasound or MRI. How is this treated? This condition is treated with medicine that replaces the thyroid hormones that your body does not make. After you begin treatment, it may take several weeks for symptoms to go away. Follow these instructions at home:  Take over-the-counter and prescription medicines only as told by your health care provider.  If you start taking any new medicines, tell your health care provider.  Keep all follow-up visits as told by your health care provider. This is important. ? As your condition improves, your dosage of thyroid hormone medicine may change. ? You will need to have blood tests regularly so that your health care provider can monitor your condition. Contact a health care provider if:  Your symptoms do not get better with treatment.  You are taking thyroid replacement medicine and you: ? Sweat a lot. ? Have tremors. ? Feel anxious. ? Lose weight rapidly. ? Cannot tolerate heat. ? Have emotional swings. ? Have diarrhea. ? Feel weak. Get help right away if you have:  Chest pain.  An  irregular heartbeat.  A rapid heartbeat.  Difficulty breathing. Summary  Hypothyroidism is when the thyroid gland does not make enough of certain hormones (it is underactive).  When the thyroid is underactive, it produces too little of the hormones thyroxine (T4) and triiodothyronine (T3).  The most common cause is Hashimoto's disease, a disease in which the body's disease-fighting system (immune system) attacks the thyroid gland. The condition can also be caused by viral infections, medicine, pregnancy, or past radiation treatment to the head or neck.  Symptoms may include weight gain, dry skin, constipation, feeling as though you do not have energy, and not being able to tolerate cold.  This condition is treated with medicine to replace the thyroid hormones that your body does not make. This information is not intended to replace advice given to you by your health care provider. Make sure you discuss any questions you have with your health care provider. Document Revised: 11/22/2017 Document Reviewed: 11/20/2017 Elsevier Patient Education  2020 Elsevier Inc. Psyllium oral capsule What is this medicine? PSYLLIUM (SIL i yum) is a bulk-forming fiber laxative. This medicine is used to treat constipation. Increasing fiber in the diet may also help lower cholesterol and promote heart health for some people. This medicine may be used for other purposes; ask your health care provider or pharmacist if you have questions. COMMON BRAND NAME(S): GenFiber, Konsyl, Metamucil, Metamucil MultiHealth, Natural Fiber Laxative, Reguloid What should I tell my health care provider before I take this medicine? They need to know if you have any of these conditions:  blockage in your  bowel  difficulty swallowing  inflammatory bowel disease  stomach or intestine problems  sudden change in bowel habits lasting more than 2 weeks  an unusual or allergic reaction to psyllium, other medicines, dyes, or  preservatives  pregnant or trying or get pregnant  breast-feeding How should I use this medicine? Take this medicine by mouth with a full glass of water. Follow the directions on the package labeling, or take as directed by your health care professional. Take your medicine at regular intervals. Do not take your medicine more often than directed. Talk to your pediatrician regarding the use of this medicine in children. While this drug may be prescribed for children as young as 26 years of age for selected conditions, precautions do apply. Overdosage: If you think you have taken too much of this medicine contact a poison control center or emergency room at once. NOTE: This medicine is only for you. Do not share this medicine with others. What if I miss a dose? If you miss a dose, take it as soon as you can. If it is almost time for your next dose, take only that dose. Do not take double or extra doses. What may interact with this medicine? Interactions are not expected. Take this product at least 2 hours before or after other medicines. This list may not describe all possible interactions. Give your health care provider a list of all the medicines, herbs, non-prescription drugs, or dietary supplements you use. Also tell them if you smoke, drink alcohol, or use illegal drugs. Some items may interact with your medicine. What should I watch for while using this medicine? Check with your doctor or health care professional if your symptoms do not start to get better or if they get worse. Stop using this medicine and contact your doctor or health care professional if you have rectal bleeding or if you have to treat your constipation for more than 1 week. These could be signs of a more serious condition. Drink several glasses of water a day while you are taking this medicine. This will help to relieve constipation and prevent dehydration. What side effects may I notice from receiving this medicine? Side  effects that you should report to your doctor or health care professional as soon as possible:  allergic reactions like skin rash, itching or hives, swelling of the face, lips, or tongue  breathing problems  chest pain  nausea, vomiting  rectal bleeding  trouble swallowing Side effects that usually do not require medical attention (report to your doctor or health care professional if they continue or are bothersome):  bloating  gas  stomach cramps This list may not describe all possible side effects. Call your doctor for medical advice about side effects. You may report side effects to FDA at 1-800-FDA-1088. Where should I keep my medicine? Keep out of the reach of children. Store at room temperature between 15 and 30 degrees C (59 and 86 degrees F). Protect from moisture. Throw away any unused medicine after the expiration date. NOTE: This sheet is a summary. It may not cover all possible information. If you have questions about this medicine, talk to your doctor, pharmacist, or health care provider.  2020 Elsevier/Gold Standard (2018-05-06 15:56:42) Cetirizine tablets What is this medicine? CETIRIZINE (se TI ra zeen) is an antihistamine. This medicine is used to treat or prevent symptoms of allergies. It is also used to help reduce itchy skin rash and hives. This medicine may be used for other purposes; ask your  health care provider or pharmacist if you have questions. COMMON BRAND NAME(S): All Day Allergy, Allergy Relief, Zyrtec, Zyrtec Hives Relief What should I tell my health care provider before I take this medicine? They need to know if you have any of these conditions:  kidney disease  liver disease  an unusual or allergic reaction to cetirizine, hydroxyzine, other medicines, foods, dyes, or preservatives  pregnant or trying to get pregnant  breast-feeding How should I use this medicine? Take this medicine by mouth with a glass of water. Follow the directions on  the prescription label. You can take this medicine with food or on an empty stomach. Take your medicine at regular times. Do not take more often than directed. You may need to take this medicine for several days before your symptoms improve. Talk to your pediatrician regarding the use of this medicine in children. Special care may be needed. While this drug may be prescribed for children as young as 80 years of age for selected conditions, precautions do apply. Overdosage: If you think you have taken too much of this medicine contact a poison control center or emergency room at once. NOTE: This medicine is only for you. Do not share this medicine with others. What if I miss a dose? If you miss a dose, take it as soon as you can. If it is almost time for your next dose, take only that dose. Do not take double or extra doses. What may interact with this medicine?  alcohol  certain medicines for anxiety or sleep  narcotic medicines for pain  other medicines for colds or allergies This list may not describe all possible interactions. Give your health care provider a list of all the medicines, herbs, non-prescription drugs, or dietary supplements you use. Also tell them if you smoke, drink alcohol, or use illegal drugs. Some items may interact with your medicine. What should I watch for while using this medicine? Visit your doctor or health care professional for regular checks on your health. Tell your doctor if your symptoms do not improve. You may get drowsy or dizzy. Do not drive, use machinery, or do anything that needs mental alertness until you know how this medicine affects you. Do not stand or sit up quickly, especially if you are an older patient. This reduces the risk of dizzy or fainting spells. Your mouth may get dry. Chewing sugarless gum or sucking hard candy, and drinking plenty of water may help. Contact your doctor if the problem does not go away or is severe. What side effects may I  notice from receiving this medicine? Side effects that you should report to your doctor or health care professional as soon as possible:  allergic reactions like skin rash, itching or hives, swelling of the face, lips, or tongue  changes in vision or hearing  fast or irregular heartbeat  trouble passing urine or change in the amount of urine Side effects that usually do not require medical attention (report to your doctor or health care professional if they continue or are bothersome):  dizziness  dry mouth  irritability  sore throat  stomach pain  tiredness This list may not describe all possible side effects. Call your doctor for medical advice about side effects. You may report side effects to FDA at 1-800-FDA-1088. Where should I keep my medicine? Keep out of the reach of children. Store at room temperature between 15 and 30 degrees C (59 and 86 degrees F). Throw away any unused medicine after  the expiration date. NOTE: This sheet is a summary. It may not cover all possible information. If you have questions about this medicine, talk to your doctor, pharmacist, or health care provider.  2020 Elsevier/Gold Standard (2015-01-04 13:44:42) Fluticasone nasal spray What is this medicine? FLUTICASONE (floo TIK a sone) is a corticosteroid. This medicine is used to treat the symptoms of allergies like sneezing, itchy red eyes, and itchy, runny, or stuffy nose. This medicine is also used to treat nasal polyps. This medicine may be used for other purposes; ask your health care provider or pharmacist if you have questions. COMMON BRAND NAME(S): ClariSpray, Flonase, Flonase Allergy Relief, Flonase Sensimist, Veramyst, XHANCE What should I tell my health care provider before I take this medicine? They need to know if you have any of these conditions:  eye disease, vision problems  infection, like tuberculosis, herpes, or fungal infection  recent surgery on nose or sinuses  taking a  corticosteroid by mouth  an unusual or allergic reaction to fluticasone, steroids, other medicines, foods, dyes, or preservatives  pregnant or trying to get pregnant  breast-feeding How should I use this medicine? This medicine is for use in the nose. Follow the directions on your product or prescription label. This medicine works best if used at regular intervals. Do not use more often than directed. Make sure that you are using your nasal spray correctly. After 6 months of daily use for allergies, talk to your doctor or health care professional before using it for a longer time. Ask your doctor or health care professional if you have any questions. Talk to your pediatrician regarding the use of this medicine in children. Special care may be needed. Some products have been used for allergies in children as young as 2 years. After 2 months of daily use without a prescription in a child, talk to your pediatrician before using it for a longer time. Use of this medicine for nasal polyps is not approved in children. Overdosage: If you think you have taken too much of this medicine contact a poison control center or emergency room at once. NOTE: This medicine is only for you. Do not share this medicine with others. What if I miss a dose? If you miss a dose, use it as soon as you remember. If it is almost time for your next dose, use only that dose and continue with your regular schedule. Do not use double or extra doses. What may interact with this medicine?  certain antibiotics like clarithromycin and telithromycin  certain medicines for fungal infections like ketoconazole, itraconazole, and voriconazole  conivaptan  nefazodone  some medicines for HIV  vaccines This list may not describe all possible interactions. Give your health care provider a list of all the medicines, herbs, non-prescription drugs, or dietary supplements you use. Also tell them if you smoke, drink alcohol, or use illegal  drugs. Some items may interact with your medicine. What should I watch for while using this medicine? Visit your healthcare professional for regular checks on your progress. Tell your healthcare professional if your symptoms do not start to get better or if they get worse. This medicine may increase your risk of getting an infection. Tell your doctor or health care professional if you are around anyone with measles or chickenpox, or if you develop sores or blisters that do not heal properly. What side effects may I notice from receiving this medicine? Side effects that you should report to your doctor or health care professional as  soon as possible:  allergic reactions like skin rash, itching or hives, swelling of the face, lips, or tongue  changes in vision  crusting or sores in the nose  nosebleed  signs and symptoms of infection like fever or chills; cough; sore throat  white patches or sores in the mouth or nose Side effects that usually do not require medical attention (report to your doctor or health care professional if they continue or are bothersome):  burning or irritation inside the nose or throat  changes in taste or smell  cough  headache This list may not describe all possible side effects. Call your doctor for medical advice about side effects. You may report side effects to FDA at 1-800-FDA-1088. Where should I keep my medicine? Keep out of the reach of children. Store at room temperature between 15 and 30 degrees C (59 and 86 degrees F). Avoid exposure to extreme heat, cold, or light. Throw away any unused medicine after the expiration date. NOTE: This sheet is a summary. It may not cover all possible information. If you have questions about this medicine, talk to your doctor, pharmacist, or health care provider.  2020 Elsevier/Gold Standard (2018-01-02 14:10:08) Hydroxyzine capsules or tablets What is this medicine? HYDROXYZINE (hye DROX i zeen) is an  antihistamine. This medicine is used to treat allergy symptoms. It is also used to treat anxiety and tension. This medicine can be used with other medicines to induce sleep before surgery. This medicine may be used for other purposes; ask your health care provider or pharmacist if you have questions. COMMON BRAND NAME(S): ANX, Atarax, Rezine, Vistaril What should I tell my health care provider before I take this medicine? They need to know if you have any of these conditions:  glaucoma  heart disease  history of irregular heartbeat  kidney disease  liver disease  lung or breathing disease, like asthma  stomach or intestine problems  thyroid disease  trouble passing urine  an unusual or allergic reaction to hydroxyzine, cetirizine, other medicines, foods, dyes or preservatives  pregnant or trying to get pregnant  breast-feeding How should I use this medicine? Take this medicine by mouth with a full glass of water. Follow the directions on the prescription label. You may take this medicine with food or on an empty stomach. Take your medicine at regular intervals. Do not take your medicine more often than directed. Talk to your pediatrician regarding the use of this medicine in children. Special care may be needed. While this drug may be prescribed for children as young as 44 years of age for selected conditions, precautions do apply. Patients over 55 years old may have a stronger reaction and need a smaller dose. Overdosage: If you think you have taken too much of this medicine contact a poison control center or emergency room at once. NOTE: This medicine is only for you. Do not share this medicine with others. What if I miss a dose? If you miss a dose, take it as soon as you can. If it is almost time for your next dose, take only that dose. Do not take double or extra doses. What may interact with this medicine? Do not take this medicine with any of the following  medications:  cisapride  dronedarone  pimozide  thioridazine This medicine may also interact with the following medications:  alcohol  antihistamines for allergy, cough, and cold  atropine  barbiturate medicines for sleep or seizures, like phenobarbital  certain antibiotics like erythromycin or clarithromycin  certain medicines for anxiety or sleep  certain medicines for bladder problems like oxybutynin, tolterodine  certain medicines for depression or psychotic disturbances  certain medicines for irregular heart beat  certain medicines for Parkinson's disease like benztropine, trihexyphenidyl  certain medicines for seizures like phenobarbital, primidone  certain medicines for stomach problems like dicyclomine, hyoscyamine  certain medicines for travel sickness like scopolamine  ipratropium  narcotic medicines for pain  other medicines that prolong the QT interval (an abnormal heart rhythm) like dofetilide This list may not describe all possible interactions. Give your health care provider a list of all the medicines, herbs, non-prescription drugs, or dietary supplements you use. Also tell them if you smoke, drink alcohol, or use illegal drugs. Some items may interact with your medicine. What should I watch for while using this medicine? Tell your doctor or health care professional if your symptoms do not improve. You may get drowsy or dizzy. Do not drive, use machinery, or do anything that needs mental alertness until you know how this medicine affects you. Do not stand or sit up quickly, especially if you are an older patient. This reduces the risk of dizzy or fainting spells. Alcohol may interfere with the effect of this medicine. Avoid alcoholic drinks. Your mouth may get dry. Chewing sugarless gum or sucking hard candy, and drinking plenty of water may help. Contact your doctor if the problem does not go away or is severe. This medicine may cause dry eyes and  blurred vision. If you wear contact lenses you may feel some discomfort. Lubricating drops may help. See your eye doctor if the problem does not go away or is severe. If you are receiving skin tests for allergies, tell your doctor you are using this medicine. What side effects may I notice from receiving this medicine? Side effects that you should report to your doctor or health care professional as soon as possible:  allergic reactions like skin rash, itching or hives, swelling of the face, lips, or tongue  changes in vision  confusion  fast, irregular heartbeat  seizures  tremor  trouble passing urine or change in the amount of urine Side effects that usually do not require medical attention (report to your doctor or health care professional if they continue or are bothersome):  constipation  drowsiness  dry mouth  headache  tiredness This list may not describe all possible side effects. Call your doctor for medical advice about side effects. You may report side effects to FDA at 1-800-FDA-1088. Where should I keep my medicine? Keep out of the reach of children. Store at room temperature between 15 and 30 degrees C (59 and 86 degrees F). Keep container tightly closed. Throw away any unused medicine after the expiration date. NOTE: This sheet is a summary. It may not cover all possible information. If you have questions about this medicine, talk to your doctor, pharmacist, or health care provider.  2020 Elsevier/Gold Standard (2018-12-01 13:19:55)   Hyperventilation Hyperventilation is a condition that happens when you breathe faster and more deeply than usual. An episode of hyperventilation usually lasts 20-30 minutes. During an episode, you may feel breathless, and your hands, feet, or mouth may tingle, spasm, or feel numb. Hyperventilation is usually triggered by stress, anxiety, or emotions. However, it can also be a sign of another condition, such as:  A lung problem,  including emphysema or asthma.  An infection.  Heart problems.  Pregnancy.  Bleeding. Follow these instructions at home:   Learn and use  deep breathing exercises that help you breathe from your diaphragm and abdomen.  Practice relaxation techniques to reduce stress, such as visualization, meditation, or gentle yoga.  If you are hyperventilating, breathe into a paper bag. This slows down breathing. Contact a health care provider if:  You continue to have episodes of hyperventilation.  Your hyperventilation gets worse. Get help right away if:  You pass out due to hyperventilation.  You have continued numbness after a period of hyperventilation. Summary  Hyperventilation is breathing more deeply and more rapidly than normal.  During an episode, you may feel breathless, and your hands, feet, or mouth may tingle, spasm, or feel numb.  If you are hyperventilating, try breathing into a paper bag. This slows down breathing. This information is not intended to replace advice given to you by your health care provider. Make sure you discuss any questions you have with your health care provider. Document Revised: 05/12/2018 Document Reviewed: 05/12/2018 Elsevier Patient Education  2020 [employer]   Finding Treatment for Addiction Addiction is a complex disease of the brain that causes an uncontrollable (compulsive) need for:  A substance. This includes alcohol, illegal drugs, or prescription medicines, such as painkillers.  An activity or behavior, such as gambling or shopping. Addiction changes the way your brain works. Because of this change:  The need for the medicine, drug, or activity can become so strong that you think about it all the time.  Getting more and more of your addiction becomes the most important thing to you.  You may find yourself leaving other activities and relationships to pursue your addiction.  You can become physically dependent on a  substance.  Your health, behavior, emotions, and relationships can change for the worse. How do I know if I need treatment for addiction? Addiction is a progressive disease. Without treatment, addiction can get worse. Living with addiction puts you at higher risk for injury, poor health, loss of employment, loss of money, and even death. You might need treatment for addiction if:  You have tried to stop or cut down, but you have not succeeded.  You find it annoying that your friends and family are concerned about your use or behavior.  You feel guilty about your use or behavior.  You need a particular substance or activity to start your day or to calm down.  You are running out of money because of your addiction.  You have done something illegal to support your addiction.  Your addiction has caused you: ? Health problems. ? Trouble in school, work, home, or with the police. ? To devote all your time to your addiction, and not to other responsibilities. ? To tell lies in order to hide your problem. What types of treatment are available? There may be options for treatment programs and plans based on your addiction, condition, needs, and preferences. No single treatment is right for everyone.  Treatment programs can be: ? Outpatient. You live at home and go to work or school, but you go to a clinic for treatment. ? Inpatient. You live and sleep at the program facility during treatment.  Programs may include: ? Medicine. You may need medicine to treat the addiction itself, or to treat anxiety or depression. ? Counseling and behavior therapy. This can help individuals and families behave in healthier ways and relate more effectively. ? Support groups. Confidential group therapy, such as a 12-step program, can help individuals and families during treatment and recovery. ? A combination of education, counseling,  and a 12-step, spirituality-based approach. What should I consider when  selecting a treatment program? Think about your individual requirements when selecting a treatment program. Ask about:  The overall approach to treatment. ? Some programs are strictly 12-step programs. Some have a more flexible approach. ? Programs may differ in length of stay, setting, and size. ? Some programs include your family in your treatment plan. Support may be offered to them throughout the treatment process, as well as instructions for them when you are discharged. ? You may continue to receive support after you have left the program.  The types of medical services that are offered. Find out if the program: ? Offers specific treatment for your particular addiction. ? Meets all of your needs, including physical and cultural needs. ? Includes any medicines you might need. ? Offers mental health counseling as part of your treatment. ? Offers the 12-step meetings at the center, or if transport is available for patients to attend meetings at other locations.  The cost and types of insurance that are accepted. ? Some programs are sponsored by the government. They support patients who do not have private insurance. ? If you do not have insurance, or if you choose to attend a program that does not accept your insurance, call the treatment center. Tell them your financial needs and whether a payment plan can be set up. ? There are also organizations that will help you find the resources for treatment. You can find them online by searching "treatment for addiction."  If the program is certified by the appropriate government agency. Where to find support  Your health care provider can help you to find the right treatment. These discussions are confidential.  The [employer] on Alcoholism and Drug Dependence (NCADD). This group has information about treatment centers and programs for people who have an addiction and for family members. ? Call: 1-800-NCA-CALL ((857)579-0134). ? Visit  the website: https://www.ncadd.org/  The Substance Abuse and Mental Health Services Administration [facility]). This organization will help you find publicly funded treatment centers, help hotlines, and counseling services near you. ? Call: 1-800-662-HELP (616 333 5041). ? Visit the website: www.findtreatment.RockT[employer].pl  The National Problem Gambling Helpline. This is a 24-hour confidential helpline for gambling addiction. ? Call: 984-699-1616 ? Visit the website: [employer] In countries outside of the [place] and [place], look in [employer] for contact information for services in your area. Follow these instructions at home:  Find supportive people who will help you stay away from your addiction and stay sober.  Do not use the substance or engage in the activity.  If you have been through treatment: ? Follow your plan. The plan is usually developed by you and your health care provider during treatment. ? Go to meetings with other people in recovery. ? Avoid people, situations, and things that lead you to do the things you are addicted to (triggers). Summary  Addiction changes the way your brain works. These changes cause a desire to repeat and increase the use of the a substance or behavior.  Addiction is a progressive disease. Without treatment, addiction can get worse. Living with addiction puts you at higher risk for injury, poor health, loss of employment, loss of money, and even death.  There may be options for treatment programs and plans based on your addiction, condition, needs, and preferences. No single treatment is right for everyone.  Your health care provider can help you to find the right treatment. These discussions are confidential. This information is not  intended to replace advice given to you by your health care provider. Make sure you discuss any questions you have with your health care provider. Document Revised: 09/02/2019 Document Reviewed:  01/08/2018 Elsevier Patient Education  2020 [employer].

## 2020-05-06 ENCOUNTER — Other Ambulatory Visit: Payer: Self-pay | Admitting: Adult Health

## 2020-05-06 DIAGNOSIS — R030 Elevated blood-pressure reading, without diagnosis of hypertension: Secondary | ICD-10-CM

## 2020-05-06 DIAGNOSIS — R0602 Shortness of breath: Secondary | ICD-10-CM

## 2020-05-06 MED ORDER — BLOOD PRESSURE MONITOR/L CUFF MISC: 1.0000 | Freq: Once | 0 refills | Status: AC | PRN

## 2020-05-06 MED ORDER — PULSE OXIMETER MISC: 1.0000 | Freq: Every day | 0 refills | Status: AC | PRN

## 2020-05-06 NOTE — Progress Notes (Signed)
Meds ordered this encounter  Medications  . Blood Pressure Monitoring (BLOOD PRESSURE MONITOR/L CUFF) MISC    Sig: 1 Device by Does not apply route once as needed for up to 1 dose. Monitor blood pressure brachial ( upper arm) once daily and keep log.    Dispense:  1 each    Refill:  0    Dispense per patients insurance coverage and call [phone] with questions or concerns.  . Misc. Devices (PULSE OXIMETER) MISC    Sig: 1 Device by Does not apply route daily as needed. Monitor heart rate and oxygen saturation once daily and keep log. Leave on finger for one minute before recording.    Dispense:  1 each    Refill:  0    Dispense per coverage.

## 2020-05-09 ENCOUNTER — Encounter: Payer: Self-pay | Admitting: Adult Health

## 2020-05-09 ENCOUNTER — Telehealth: Payer: Self-pay

## 2020-05-09 NOTE — Telephone Encounter (Signed)
Patient called back stating the prescription has to be sent to a medical supply store in order to be covered by insurance company/[facility] which she said she spoke them but they didn't have a order in the system

## 2020-05-09 NOTE — Telephone Encounter (Signed)
Copied from CRM [id number]. Topic: General - Other >> May 09, 2020  1:34 PM Elliot Gault wrote: Patient was advised by pharmacist Blood Pressure Monitoring (BLOOD PRESSURE MONITOR/L CUFF) MISC and Misc. Devices (PULSE OXIMETER) MISC  have to be sent to a medical supply company. Patient would like a follow up call when and where rx will be sent

## 2020-05-10 NOTE — Telephone Encounter (Signed)
Contacted [facility] Supply store on [address] they states that they sale them but do not take prescriptions or file insurance so patient would have to pay out of pocket. I relayed message to patient and she understood patient states that she will shop around and purchase out of pocket. KW

## 2020-05-10 NOTE — Telephone Encounter (Signed)
Hard script written to send to medical supply of per patients choice.

## 2020-05-20 ENCOUNTER — Encounter: Payer: Self-pay | Admitting: Adult Health

## 2020-05-24 ENCOUNTER — Other Ambulatory Visit: Payer: Self-pay

## 2020-05-25 ENCOUNTER — Ambulatory Visit (INDEPENDENT_AMBULATORY_CARE_PROVIDER_SITE_OTHER): Payer: Medicaid Other | Admitting: Gastroenterology

## 2020-05-25 ENCOUNTER — Other Ambulatory Visit: Payer: Self-pay

## 2020-05-25 ENCOUNTER — Encounter: Payer: Self-pay | Admitting: Gastroenterology

## 2020-05-25 VITALS — BP 135/84 | HR 88 | Temp 97.8°F | Ht 67.0 in | Wt 191.5 lb

## 2020-05-25 DIAGNOSIS — F1290 Cannabis use, unspecified, uncomplicated: Secondary | ICD-10-CM

## 2020-05-25 DIAGNOSIS — K5900 Constipation, unspecified: Secondary | ICD-10-CM | POA: Diagnosis not present

## 2020-05-25 DIAGNOSIS — R110 Nausea: Secondary | ICD-10-CM | POA: Diagnosis not present

## 2020-05-25 DIAGNOSIS — K625 Hemorrhage of anus and rectum: Secondary | ICD-10-CM

## 2020-05-25 MED ORDER — MAGNESIUM CITRATE PO SOLN: 1.0000 | Freq: Once | ORAL | 0 refills | Status: AC

## 2020-05-25 MED ORDER — ONDANSETRON HCL 4 MG PO TABS: 4.0000 mg | ORAL_TABLET | Freq: Three times a day (TID) | ORAL | 1 refills | Status: DC | PRN

## 2020-05-25 MED ORDER — LUBIPROSTONE 24 MCG PO CAPS: 24.0000 ug | ORAL_CAPSULE | Freq: Two times a day (BID) | ORAL | 3 refills | Status: DC

## 2020-05-25 MED ORDER — NA SULFATE-K SULFATE-MG SULF 17.5-3.13-1.6 GM/177ML PO SOLN: 354.0000 mL | Freq: Once | ORAL | 0 refills | Status: AC

## 2020-05-25 NOTE — Progress Notes (Signed)
Cephas Darby, MD [address]  [place]  [place], [place] [address]  Main: [phone]  Fax: [phone]    Gastroenterology Consultation  Referring Provider:     Sharmon Leyden* Primary Care Physician:  Doreen Beam, FNP Primary Gastroenterologist:  Dr. Cephas Darby Reason for Consultation:     Chronic constipation, chronic nausea, rectal bleeding        HPI:   Pamela Mooney is a 39 y.o. female referred by Dr. Doreen Beam, FNP  for consultation & management of chronic constipation.  Patient has several years history of irregular bowel habits, hard stools every 2 to 3 days associated blood on wiping.  She also reports her stools are dark associated with left lower quadrant pain, abdominal bloating, nausea.  Patient smokes marijuana at night to night to help with nausea.  She denies any weakness.  She is also with new diagnosis of Hashimoto's thyroiditis, started on Synthroid.  She is depression  maintained on Wellbutrin and, trazodone. Labs revealed normal CBC, CMP.  Low TSH, severe vitamin D deficiency She is on Metamucil once a day.  Acknowledges drinking carbonated beverages daily.  Decreased intake of water She acknowledges that her eating habits are not balanced  NSAIDs: None   Antiplts/Anticoagulants/Anti thrombotics: None  GI Procedures: None No known family history of GI malignancy  Past Medical History:  Diagnosis Date  . Thyroid disease     History reviewed. No pertinent surgical history.  Current Outpatient Medications:  .  ARIPiprazole (ABILIFY) 2 MG tablet, Take 2 mg by mouth daily., Disp: , Rfl:  .  Blood Pressure Monitoring (BLOOD PRESSURE MONITOR/L CUFF) MISC, 1 Device by Does not apply route once as needed for up to 1 dose. Monitor blood pressure brachial ( upper arm) once daily and keep log., Disp: 1 each, Rfl: 0 .  buPROPion (WELLBUTRIN SR) 150 MG 12 hr tablet, Take 1 tablet (150 mg total) by mouth 2 (two) times  daily. Start 150 mg daily for one week and then can increase as above., Disp: 60 tablet, Rfl: 0 .  cetirizine (ZYRTEC) 10 MG tablet, Take 1 tablet (10 mg total) by mouth daily. For seasonal allergies, Disp: 30 tablet, Rfl: 2 .  fluticasone (FLONASE) 50 MCG/ACT nasal spray, Place 1 spray into both nostrils daily. For seasonal allergies, Disp: 16 g, Rfl: 2 .  hydrOXYzine (ATARAX/VISTARIL) 50 MG tablet, Take 1 tablet (50 mg total) by mouth every 8 (eight) hours as needed for anxiety or nausea (WILL CAUSE DROWSINESS, DO NOT DRIVE, NO ALCOHOL or other sedatives, NO LEGAL DECISIONS.)., Disp: 30 tablet, Rfl: 0 .  levothyroxine (SYNTHROID) 50 MCG tablet, Take 1 tablet (50 mcg total) by mouth daily before breakfast., Disp: 90 tablet, Rfl: 0 .  Misc. Devices (PULSE OXIMETER) MISC, 1 Device by Does not apply route daily as needed. Monitor heart rate and oxygen saturation once daily and keep log. Leave on finger for one minute before recording., Disp: 1 each, Rfl: 0 .  pantoprazole (PROTONIX) 40 MG tablet, Take 1 tablet (40 mg total) by mouth daily., Disp: 42 tablet, Rfl: 0 .  psyllium (HYDROCIL/METAMUCIL) 95 % PACK, Take 1 packet by mouth daily. Mix with a full 8 ounces of water stir and drink for constipation., Disp: 30 each, Rfl: 0 .  Vitamin D, Ergocalciferol, (DRISDOL) 1.25 MG (50000 UNIT) CAPS capsule, Take 1 capsule (50,000 Units total) by mouth every 7 (seven) days. (taking one tablet per week), Disp: 12 capsule,  Rfl: 0 .  lubiprostone (AMITIZA) 24 MCG capsule, Take 1 capsule (24 mcg total) by mouth 2 (two) times daily with a meal., Disp: 60 capsule, Rfl: 3 .  magnesium citrate SOLN, Take 296 mLs (1 Bottle total) by mouth once for 1 dose., Disp: 195 mL, Rfl: 0 .  Na Sulfate-K Sulfate-Mg Sulf 17.5-3.13-1.6 GM/177ML SOLN, Take 354 mLs by mouth once for 1 dose., Disp: 354 mL, Rfl: 0 .  ondansetron (ZOFRAN) 4 MG tablet, Take 1 tablet (4 mg total) by mouth every 8 (eight) hours as needed for nausea or vomiting.,  Disp: 30 tablet, Rfl: 1   History reviewed. No pertinent family history.   Social History   Tobacco Use  . Smoking status: Never Smoker  . Smokeless tobacco: Never Used  Substance Use Topics  . Alcohol use: Not Currently  . Drug use: Never    Allergies as of 05/25/2020  . (No Known Allergies)    Review of Systems:    All systems reviewed and negative except where noted in HPI.   Physical Exam:  BP 135/84 (BP Location: Left Arm, Patient Position: Sitting, Cuff Size: Normal)   Pulse 88   Temp 97.8 F (36.6 C) (Oral)   Ht 5\' 7"  (1.702 m)   Wt 191 lb 8 oz (86.9 kg)   BMI 29.99 kg/m  No LMP recorded.  General:   Alert,  Well-developed, well-nourished, pleasant and cooperative in NAD Head:  Normocephalic and atraumatic. Eyes:  Sclera clear, no icterus.   Conjunctiva pink. Ears:  Normal auditory acuity. Nose:  No deformity, discharge, or lesions. Mouth:  No deformity or lesions,oropharynx pink & moist. Neck:  Supple; no masses or thyromegaly. Lungs:  Respirations even and unlabored.  Clear throughout to auscultation.   No wheezes, crackles, or rhonchi. No acute distress. Heart:  Regular rate and rhythm; no murmurs, clicks, rubs, or gallops. Abdomen:  Normal bowel sounds. Soft, non-tender and non-distended without masses, hepatosplenomegaly or hernias noted.  No guarding or rebound tenderness.   Rectal: Not performed Msk:  Symmetrical without gross deformities. Good, equal movement & strength bilaterally. Pulses:  Normal pulses noted. Extremities:  No clubbing or edema.  No cyanosis. Neurologic:  Alert and oriented x3;  grossly normal neurologically. Skin:  Intact without significant lesions or rashes. No jaundice. Psych:  Alert and cooperative. Normal mood and affect.  Imaging Studies: Reviewed  Assessment and Plan:   Pamela Mooney is a 39 y.o. female with hypothyroidism, chronic marijuana use, depression is seen in consultation chronic constipation, nausea and  rectal bleeding  Chronic constipation Discussed about high-fiber diet, information provided Advised to stop carbonated beverages Adequate intake of water Trial of Amitiza 24 MCG 2 times a day, prescription sent Optimize thyroid replacement therapy for hypothyroidism  Chronic nausea Advised to stop marijuana use Zofran as needed  Rectal bleeding Recommend diagnostic colonoscopy with 2-day prep  I have discussed alternative options, risks & benefits,  which include, but are not limited to, bleeding, infection, perforation,respiratory complication & drug reaction.  The patient agrees with this plan & written consent will be obtained.      Follow up in 3 months   30, MD

## 2020-05-25 NOTE — Patient Instructions (Signed)

## 2020-05-30 NOTE — Progress Notes (Signed)
     No show for appointment. Letter sent. No patient contact in office.

## 2020-05-31 ENCOUNTER — Ambulatory Visit (INDEPENDENT_AMBULATORY_CARE_PROVIDER_SITE_OTHER): Payer: Medicaid Other | Admitting: Adult Health

## 2020-05-31 DIAGNOSIS — Z5329 Procedure and treatment not carried out because of patient's decision for other reasons: Secondary | ICD-10-CM

## 2020-06-01 ENCOUNTER — Telehealth (INDEPENDENT_AMBULATORY_CARE_PROVIDER_SITE_OTHER): Payer: Medicaid Other | Admitting: Adult Health

## 2020-06-01 ENCOUNTER — Encounter: Payer: Self-pay | Admitting: Adult Health

## 2020-06-01 DIAGNOSIS — E039 Hypothyroidism, unspecified: Secondary | ICD-10-CM | POA: Diagnosis not present

## 2020-06-01 DIAGNOSIS — R05 Cough: Secondary | ICD-10-CM

## 2020-06-01 DIAGNOSIS — R110 Nausea: Secondary | ICD-10-CM

## 2020-06-01 DIAGNOSIS — A084 Viral intestinal infection, unspecified: Secondary | ICD-10-CM

## 2020-06-01 DIAGNOSIS — R059 Cough, unspecified: Secondary | ICD-10-CM

## 2020-06-01 MED ORDER — ONDANSETRON HCL 4 MG PO TABS: 4.0000 mg | ORAL_TABLET | Freq: Three times a day (TID) | ORAL | 1 refills | Status: DC | PRN

## 2020-06-01 MED ORDER — PANTOPRAZOLE SODIUM 40 MG PO TBEC: 40.0000 mg | DELAYED_RELEASE_TABLET | Freq: Every day | ORAL | 0 refills | Status: DC

## 2020-06-01 NOTE — Progress Notes (Signed)
MyChart Video Visit    Virtual Visit via Video Note   This visit type was conducted due to national recommendations for restrictions regarding the COVID-19 Pandemic (e.g. social distancing) in an effort to limit this patient's exposure and mitigate transmission in our community. This patient is at least at moderate risk for complications without adequate follow up. This format is felt to be most appropriate for this patient at this time. Physical exam was limited by quality of the video and audio technology used for the visit.   Patient location: at home  Provider location: Provider: Provider's office at  [facility], [place].      Patient: Pamela Mooney   DOB: [birth year]   39 y.o. Female  MRN: [id number] Visit Date: 06/01/2020  Today's healthcare provider: Marcille Buffy, FNP   Chief Complaint  Patient presents with   Emesis   Subjective    Emesis  This is a new problem. The current episode started yesterday. The problem occurs intermittently (patient reports that symptom begins hours after taking medication in moring). The problem has been waxing and waning. The emesis has an appearance of stomach contents. There has been no fever. Associated symptoms include headaches. Pertinent negatives include no abdominal pain, arthralgias, chest pain, chills, coughing, diarrhea, dizziness, fever, myalgias, sweats, URI or weight loss. Treatments tried: dramamine. The treatment provided mild relief.    She has seen gastroenterologist for chronic nausea/ constipation and rectal bleeding.  She has been taking her synthroid as directed and not within two hours of other medications.  Set for colonoscopy on 06/17/20 with Dr. Marius Ditch. Reviewed note - she has been given Zofran OTC.   She was started Amitiza 24 mcg capsul BID with meal. She also was given a sample of Trulance as well and is almost done with it.  She was also given Zofran.   She has mild cough that  started at night to she reports. She reports this started two days ago.    She reports she has actually been feeling well. Until 2 days ago she was working and felt nauseated, she had to go home due to vomiting, this last two days intermittently. She has not vomiting; No diarrhea. She says she vomited food and " stomach acid" .  She has been having more energy before this new episode. Today she has gas pains only. Denies any rectal bleeding.   Patient  denies any fever, body aches,chills, rash, chest pain, shortness of breath, nausea, vomiting, or diarrhea.  Denies dizziness, lightheadedness, pre syncopal or syncopal episodes.   Denies any known exposures.   No LMP recorded. 05/04/20 LMP     Patient Active Problem List   Diagnosis Date Noted   Blood pressure elevated without history of HTN 04/27/2020   Screening for lipid disorders 04/27/2020   Marijuana use 04/27/2020   Nausea 04/27/2020   Fatigue 04/27/2020   Wheezing 04/27/2020   Hashimoto's thyroiditis 04/27/2020   Anxiety 04/27/2020   Body mass index 29.0-29.9, adult 04/27/2020   MDD (major depressive disorder), recurrent episode, moderate ([place]) 08/19/2019   Other specified hypothyroidism 07/14/2018   Past Medical History:  Diagnosis Date   Thyroid disease    No Known Allergies    Medications: Outpatient Medications Prior to Visit  Medication Sig   ARIPiprazole (ABILIFY) 2 MG tablet Take 2 mg by mouth daily.   Blood Pressure Monitoring (BLOOD PRESSURE MONITOR/L CUFF) MISC 1 Device by Does not apply route once as needed for up  to 1 dose. Monitor blood pressure brachial ( upper arm) once daily and keep log.   buPROPion (WELLBUTRIN SR) 150 MG 12 hr tablet Take 1 tablet (150 mg total) by mouth 2 (two) times daily. Start 150 mg daily for one week and then can increase as above.   lubiprostone (AMITIZA) 24 MCG capsule Take 1 capsule (24 mcg total) by mouth 2 (two) times daily with a meal.   Misc. Devices  (PULSE OXIMETER) MISC 1 Device by Does not apply route daily as needed. Monitor heart rate and oxygen saturation once daily and keep log. Leave on finger for one minute before recording.   psyllium (HYDROCIL/METAMUCIL) 95 % PACK Take 1 packet by mouth daily. Mix with a full 8 ounces of water stir and drink for constipation.   Vitamin D, Ergocalciferol, (DRISDOL) 1.25 MG (50000 UNIT) CAPS capsule Take 1 capsule (50,000 Units total) by mouth every 7 (seven) days. (taking one tablet per week)   [DISCONTINUED] levothyroxine (SYNTHROID) 50 MCG tablet Take 1 tablet (50 mcg total) by mouth daily before breakfast.   [DISCONTINUED] pantoprazole (PROTONIX) 40 MG tablet Take 1 tablet (40 mg total) by mouth daily.   cetirizine (ZYRTEC) 10 MG tablet Take 1 tablet (10 mg total) by mouth daily. For seasonal allergies (Patient not taking: Reported on 06/01/2020)   fluticasone (FLONASE) 50 MCG/ACT nasal spray Place 1 spray into both nostrils daily. For seasonal allergies (Patient not taking: Reported on 06/01/2020)   hydrOXYzine (ATARAX/VISTARIL) 50 MG tablet Take 1 tablet (50 mg total) by mouth every 8 (eight) hours as needed for anxiety or nausea (WILL CAUSE DROWSINESS, DO NOT DRIVE, NO ALCOHOL or other sedatives, NO LEGAL DECISIONS.). (Patient not taking: Reported on 06/01/2020)   [DISCONTINUED] ondansetron (ZOFRAN) 4 MG tablet Take 1 tablet (4 mg total) by mouth every 8 (eight) hours as needed for nausea or vomiting. (Patient not taking: Reported on 06/01/2020)   No facility-administered medications prior to visit.    Review of Systems  Constitutional: Negative for chills, fever and weight loss.  Respiratory: Negative for cough.   Cardiovascular: Negative for chest pain.  Gastrointestinal: Positive for vomiting. Negative for abdominal pain and diarrhea.  Musculoskeletal: Negative for arthralgias and myalgias.  Neurological: Positive for headaches. Negative for dizziness.    Last CBC Lab Results  Component  Value Date   WBC 5.6 06/02/2020   HGB 11.5 (L) 06/02/2020   HCT 36.0 06/02/2020   MCV 84.7 06/02/2020   MCH 27.1 06/02/2020   RDW 15.1 06/02/2020   PLT 345 06/02/2020      Objective    There were no vitals taken for this visit.   Physical Exam    Patient is alert and oriented and responsive to questions Engages in conversation with provider. Speaks in full sentences without any pauses without any shortness of breath or distress.    Assessment & Plan     Viral gastroenteritis  Nausea - Plan: pantoprazole (PROTONIX) 40 MG tablet, SARS-CoV-2 Antibody, IgA, SARS-CoV-2 Antibody, IgM, CBC with Differential/Platelet, Comprehensive Metabolic Panel (CMET)  Nausea without vomiting - Plan: ondansetron (ZOFRAN) 4 MG tablet  Cough - Plan: SARS-CoV-2 Antibody, IgA, SARS-CoV-2 Antibody, IgM  Hypothyroidism, unspecified type - Plan: TSH + free T4  Meds ordered this encounter  Medications   pantoprazole (PROTONIX) 40 MG tablet    Sig: Take 1 tablet (40 mg total) by mouth daily.    Dispense:  90 tablet    Refill:  0   ondansetron (ZOFRAN) 4 MG tablet  Sig: Take 1 tablet (4 mg total) by mouth every 8 (eight) hours as needed for nausea or vomiting.    Dispense:  30 tablet    Refill:  1   One month check of TSH today - may adjust dose. Advised on how to take synthroid.  Close monitoring and dose adjustments needed.  Orders Placed This Encounter  Procedures   TSH + free T4   SARS-CoV-2 Antibody, IgA    Order Specific Question:   Is this test for diagnosis or screening    Answer:   Diagnosis of ill patient    Order Specific Question:   Symptomatic for COVID-19 as defined by CDC    Answer:   Yes    Order Specific Question:   Date of Symptom Onset    Answer:   05/30/2020    Order Specific Question:   Hospitalized for COVID-19    Answer:   No    Order Specific Question:   Admitted to ICU for COVID-19    Answer:   No    Order Specific Question:   Previously tested for COVID-19     Answer:   No    Order Specific Question:   Resident in a congregate (group) care setting    Answer:   No    Order Specific Question:   Employed in healthcare setting    Answer:   No    Order Specific Question:   Pregnant    Answer:   No   SARS-CoV-2 Antibody, IgM    Order Specific Question:   Is this test for diagnosis or screening    Answer:   Diagnosis of ill patient    Order Specific Question:   Symptomatic for COVID-19 as defined by CDC    Answer:   Yes    Order Specific Question:   Date of Symptom Onset    Answer:   05/30/2020    Order Specific Question:   Hospitalized for COVID-19    Answer:   No    Order Specific Question:   Admitted to ICU for COVID-19    Answer:   No    Order Specific Question:   Previously tested for COVID-19    Answer:   No    Order Specific Question:   Resident in a congregate (group) care setting    Answer:   No    Order Specific Question:   Employed in healthcare setting    Answer:   No    Order Specific Question:   Pregnant    Answer:   No   CBC with Differential/Platelet   Comprehensive Metabolic Panel (CMET)   Return in about 1 month (around 07/01/2020), or if symptoms worsen or fail to improve, for at any time for any worsening symptoms, Go to Emergency room/ urgent care if worse.    Advised patient call the office or your primary care doctor for an appointment if no improvement within 72 hours or if any symptoms change or worsen at any time  Advised ER or urgent Care if after hours or on weekend. Call 911 for emergency symptoms at any time.Patinet verbalized understanding of all instructions given/reviewed and treatment plan and has no further questions or concerns at this time.    I discussed the assessment and treatment plan with the patient. The patient was provided an opportunity to ask questions and all were answered. The patient agreed with the plan and demonstrated an understanding of the instructions.   The patient was  advised to call  back or seek an in-person evaluation if the symptoms worsen or if the condition fails to improve as anticipated.  I provided 30  minutes of non-face-to-face time during this encounter.  IBeverely Pace Ralyn Stlaurent, FNP, have reviewed all documentation for this visit. The documentation on 06/06/20 for the exam, diagnosis, procedures, and orders are all accurate and complete.  Jairo Ben, FNP [facility] [phone] (phone) [phone] (fax)  [facility]

## 2020-06-02 ENCOUNTER — Encounter: Payer: Self-pay | Admitting: Adult Health

## 2020-06-02 ENCOUNTER — Other Ambulatory Visit
Admission: RE | Admit: 2020-06-02 | Discharge: 2020-06-02 | Disposition: A | Discharge status: Home / Self Care | Payer: Medicaid Other | Attending: Adult Health | Admitting: Adult Health

## 2020-06-02 ENCOUNTER — Ambulatory Visit: Payer: Medicaid Other | Admitting: Adult Health

## 2020-06-02 ENCOUNTER — Other Ambulatory Visit: Payer: Self-pay | Admitting: Adult Health

## 2020-06-02 ENCOUNTER — Other Ambulatory Visit: Payer: Self-pay

## 2020-06-02 DIAGNOSIS — E039 Hypothyroidism, unspecified: Secondary | ICD-10-CM

## 2020-06-02 DIAGNOSIS — R110 Nausea: Secondary | ICD-10-CM | POA: Diagnosis present

## 2020-06-02 DIAGNOSIS — R05 Cough: Secondary | ICD-10-CM | POA: Insufficient documentation

## 2020-06-02 LAB — CBC WITH DIFFERENTIAL/PLATELET
Abs Immature Granulocytes: 0.01 10*3/uL (ref 0.00–0.07)
Basophils Absolute: 0.1 10*3/uL (ref 0.0–0.1)
Basophils Relative: 1 %
Eosinophils Absolute: 0.3 10*3/uL (ref 0.0–0.5)
Eosinophils Relative: 5 %
HCT: 36 % (ref 36.0–46.0)
Hemoglobin: 11.5 g/dL — ABNORMAL LOW (ref 12.0–15.0)
Immature Granulocytes: 0 %
Lymphocytes Relative: 28 %
Lymphs Abs: 1.5 10*3/uL (ref 0.7–4.0)
MCH: 27.1 pg (ref 26.0–34.0)
MCHC: 31.9 g/dL (ref 30.0–36.0)
MCV: 84.7 fL (ref 80.0–100.0)
Monocytes Absolute: 0.5 10*3/uL (ref 0.1–1.0)
Monocytes Relative: 9 %
Neutro Abs: 3.2 10*3/uL (ref 1.7–7.7)
Neutrophils Relative %: 57 %
Platelets: 345 10*3/uL (ref 150–400)
RBC: 4.25 MIL/uL (ref 3.87–5.11)
RDW: 15.1 % (ref 11.5–15.5)
WBC: 5.6 10*3/uL (ref 4.0–10.5)
nRBC: 0 % (ref 0.0–0.2)

## 2020-06-02 LAB — COMPREHENSIVE METABOLIC PANEL
ALT: 12 U/L (ref 0–44)
AST: 18 U/L (ref 15–41)
Albumin: 4 g/dL (ref 3.5–5.0)
Alkaline Phosphatase: 39 U/L (ref 38–126)
Anion gap: 4 — ABNORMAL LOW (ref 5–15)
BUN: 11 mg/dL (ref 6–20)
CO2: 23 mmol/L (ref 22–32)
Calcium: 8.5 mg/dL — ABNORMAL LOW (ref 8.9–10.3)
Chloride: 108 mmol/L (ref 98–111)
Creatinine, Ser: 0.7 mg/dL (ref 0.44–1.00)
GFR calc Af Amer: >60 mL/min (ref >60)
GFR calc non Af Amer: >60 mL/min (ref >60)
Glucose, Bld: 88 mg/dL (ref 70–99)
Potassium: 4.2 mmol/L (ref 3.5–5.1)
Sodium: 135 mmol/L (ref 135–145)
Total Bilirubin: 0.6 mg/dL (ref 0.3–1.2)
Total Protein: 7.9 g/dL (ref 6.5–8.1)

## 2020-06-02 LAB — T4, FREE: Free T4: 0.59 ng/dL — ABNORMAL LOW (ref 0.61–1.12)

## 2020-06-02 LAB — TSH: TSH: 16.812 u[IU]/mL — ABNORMAL HIGH (ref 0.350–4.500)

## 2020-06-02 MED ORDER — LEVOTHYROXINE SODIUM 75 MCG PO TABS: 75.0000 ug | ORAL_TABLET | Freq: Every day | ORAL | 0 refills | Status: DC

## 2020-06-02 NOTE — Progress Notes (Signed)
Thyroid levels are improving. Keep follow up with gastroenterolgy as well, hemoglobin is slightly low at 11.5 she is recovering from gastritis, and may have been dehydrated. Be sure no rectal bleeding, heavy menses , or dark stools ?  Ok to start a multivitamin with iron, take levothyroxine alone and not within two hours before or after any other medication, and not with dairy products.   Covid test still pending.  Discontinue levothyroxine 50 mcg and start dose below sent to [facility]. Recheck TSH in around 6 weeks which should be around the time you see endocrinology.   levothyroxine (SYNTHROID) 75 MCG tablet   Sig: Take 1 tablet (75 mcg total) by mouth daily.   Dispense:  90 tablet   Refill:  0

## 2020-06-02 NOTE — Progress Notes (Signed)
Thyroid levels are improving. Keep follow up with gastroenterolgy as well, hemoglobin is slightly low at 11.5 she is recovering from gastritis, and may have been dehydrated. Be sure no rectal bleeding, heavy menses , or dark stools ?  Ok to start a multivitamin with iron, take levothyroxine alone and not within two hours before or after any other medication, and not with dairy products.   Covid test still pending.  Discontinue levothyroxine 50 mcg and start dose below sent to [facility]. Recheck TSH in around 6 weeks which should be around the time you see endocrinology.   levothyroxine (SYNTHROID) 75 MCG tablet   Sig: Take 1 tablet (75 mcg total) by mouth daily.   Dispense:  90 tablet   Refill:  0 

## 2020-06-02 NOTE — Progress Notes (Signed)
Meds ordered this encounter  Medications  . levothyroxine (SYNTHROID) 75 MCG tablet    Sig: Take 1 tablet (75 mcg total) by mouth daily.    Dispense:  90 tablet    Refill:  0    Medications Discontinued During This Encounter  Medication Reason  . levothyroxine (SYNTHROID) 50 MCG tablet Completed Course

## 2020-06-03 ENCOUNTER — Encounter: Payer: Self-pay | Admitting: Adult Health

## 2020-06-03 LAB — MISC LABCORP TEST (SEND OUT): Labcorp test code: 164034

## 2020-06-04 LAB — MISC LABCORP TEST (SEND OUT): Labcorp test code: 164072

## 2020-06-06 NOTE — Progress Notes (Signed)
Negative Covid antibodies.Marland Kitchen

## 2020-06-06 NOTE — Patient Instructions (Signed)
Hypothyroidism  Hypothyroidism is when the thyroid gland does not make enough of certain hormones (it is underactive). The thyroid gland is a small gland located in the lower front part of the neck, just in front of the windpipe (trachea). This gland makes hormones that help control how the body uses food for energy (metabolism) as well as how the heart and brain function. These hormones also play a role in keeping your bones strong. When the thyroid is underactive, it produces too little of the hormones thyroxine (T4) and triiodothyronine (T3). What are the causes? This condition may be caused by:  Hashimoto's disease. This is a disease in which the body's disease-fighting system (immune system) attacks the thyroid gland. This is the most common cause.  Viral infections.  Pregnancy.  Certain medicines.  Birth defects.  Past radiation treatments to the head or neck for cancer.  Past treatment with radioactive iodine.  Past exposure to radiation in the environment.  Past surgical removal of part or all of the thyroid.  Problems with a gland in the center of the brain (pituitary gland).  Lack of enough iodine in the diet. What increases the risk? You are more likely to develop this condition if:  You are female.  You have a family history of thyroid conditions.  You use a medicine called lithium.  You take medicines that affect the immune system (immunosuppressants). What are the signs or symptoms? Symptoms of this condition include:  Feeling as though you have no energy (lethargy).  Not being able to tolerate cold.  Weight gain that is not explained by a change in diet or exercise habits.  Lack of appetite.  Dry skin.  Coarse hair.  Menstrual irregularity.  Slowing of thought processes.  Constipation.  Sadness or depression. How is this diagnosed? This condition may be diagnosed based on:  Your symptoms, your medical history, and a physical exam.  Blood  tests. You may also have imaging tests, such as an ultrasound or MRI. How is this treated? This condition is treated with medicine that replaces the thyroid hormones that your body does not make. After you begin treatment, it may take several weeks for symptoms to go away. Follow these instructions at home:  Take over-the-counter and prescription medicines only as told by your health care provider.  If you start taking any new medicines, tell your health care provider.  Keep all follow-up visits as told by your health care provider. This is important. ? As your condition improves, your dosage of thyroid hormone medicine may change. ? You will need to have blood tests regularly so that your health care provider can monitor your condition. Contact a health care provider if:  Your symptoms do not get better with treatment.  You are taking thyroid replacement medicine and you: ? Sweat a lot. ? Have tremors. ? Feel anxious. ? Lose weight rapidly. ? Cannot tolerate heat. ? Have emotional swings. ? Have diarrhea. ? Feel weak. Get help right away if you have:  Chest pain.  An irregular heartbeat.  A rapid heartbeat.  Difficulty breathing. Summary  Hypothyroidism is when the thyroid gland does not make enough of certain hormones (it is underactive).  When the thyroid is underactive, it produces too little of the hormones thyroxine (T4) and triiodothyronine (T3).  The most common cause is Hashimoto's disease, a disease in which the body's disease-fighting system (immune system) attacks the thyroid gland. The condition can also be caused by viral infections, medicine, pregnancy, or past   radiation treatment to the head or neck.  Symptoms may include weight gain, dry skin, constipation, feeling as though you do not have energy, and not being able to tolerate cold.  This condition is treated with medicine to replace the thyroid hormones that your body does not make. This information  is not intended to replace advice given to you by your health care provider. Make sure you discuss any questions you have with your health care provider. Document Revised: 11/22/2017 Document Reviewed: 11/20/2017 Elsevier Patient Education  2020 [employer]. Viral Gastroenteritis, Adult  Viral gastroenteritis is also known as the stomach flu. This condition may affect your stomach, your small intestine, and your large intestine. It can cause sudden watery poop (diarrhea), fever, and throwing up (vomiting). This condition is caused by certain germs (viruses). These germs can be passed from person to person very easily (are contagious). Having watery poop and throwing up can make you feel weak and cause you to not have enough water in your body (get dehydrated). This can make you tired and thirsty, make you have a dry mouth, and make it so you pee (urinate) less often. It is important to replace the fluids that you lose from having watery poop and throwing up. What are the causes?  You can get sick by catching viruses from other people.  You can also get sick by: ? Eating food, drinking water, or touching a surface that has the viruses on it (is contaminated). ? Sharing utensils or other personal items with a person who is sick. What increases the risk?  Having a weak body defense system (immune system).  Living with one or more children who are younger than 37 years old.  Living in a nursing home.  Going on cruise ships. What are the signs or symptoms? Symptoms of this condition start suddenly. Symptoms may last for a few days or for as long as a week.  Common symptoms include: ? Watery poop. ? Throwing up.  Other symptoms include: ? Fever. ? Headache. ? Feeling tired (fatigue). ? Pain in the belly (abdomen). ? Chills. ? Feeling weak. ? Feeling sick to your stomach (nauseous). ? Muscle aches. ? Not feeling hungry. How is this treated?  This condition typically goes away on  its own.  The focus of treatment is to replace the fluids that you lose. This condition may be treated with: ? An ORS (oral rehydration solution). This is a drink that is sold at pharmacies and stores. ? Medicines to help with your symptoms. ? Probiotic supplements to reduce symptoms of diarrhea. ? Fluids given through an IV tube, if needed.  Older adults and people with other diseases or a weak body defense system are at higher risk for not having enough water in the body. Follow these instructions at home: Eating and drinking   Take an ORS as told by your doctor.  Drink clear fluids in small amounts as you are able. Clear fluids include: ? Water. ? Ice chips. ? Fruit juice with water added to it (diluted). ? Low-calorie sports drinks.  Drink enough fluid to keep your pee (urine) pale yellow.  Eat small amounts of healthy foods every 3-4 hours as you are able. This may include whole grains, fruits, vegetables, lean meats, and yogurt.  Avoid fluids that have a lot of sugar or caffeine in them, such as energy drinks, sports drinks, and soda.  Avoid spicy or fatty foods.  Avoid alcohol. General instructions   Wash your hands  often. This is very important after you have watery poop or you throw up. If you cannot use soap and water, use hand sanitizer.  Make sure that all people in your home wash their hands well and often.  Take over-the-counter and prescription medicines only as told by your doctor.  Rest at home while you get better.  Watch your condition for any changes.  Take a warm bath to help with any burning or pain from having watery poop.  Keep all follow-up visits as told by your doctor. This is important. Contact a doctor if:  You cannot keep fluids down.  Your symptoms get worse.  You have new symptoms.  You feel light-headed.  You feel dizzy.  You have muscle cramps. Get help right away if:  You have chest pain.  You feel very weak.  You  pass out (faint).  You see blood in your throw-up.  Your throw-up looks like coffee grounds.  You have bloody or black poop (stools) or poop that looks like tar.  You have a very bad headache, or a stiff neck, or both.  You have a rash.  You have very bad pain, cramping, or bloating in your belly.  You have trouble breathing.  You are breathing very quickly.  You have a fast heartbeat.  Your skin feels cold and clammy.  You feel mixed up (confused).  You have pain when you pee.  You have signs of not having enough water in the body, such as: ? Dark pee, hardly any pee, or no pee. ? Cracked lips. ? Dry mouth. ? Sunken eyes. ? Feeling very sleepy. ? Feeling weak. Summary  Viral gastroenteritis is also known as the stomach flu.  This condition can cause sudden watery poop (diarrhea), fever, and throwing up (vomiting).  These germs can be passed from person to person very easily.  Take an ORS as told by your doctor. This is a drink that is sold at pharmacies and stores.  Drink fluids in small amounts many times each day as you are able. This information is not intended to replace advice given to you by your health care provider. Make sure you discuss any questions you have with your health care provider. Document Revised: 10/15/2018 Document Reviewed: 10/15/2018 Elsevier Patient Education  2020 [employer].

## 2020-06-15 ENCOUNTER — Other Ambulatory Visit: Admission: RE | Admit: 2020-06-15 | Payer: Medicaid Other | Source: Ambulatory Visit

## 2020-06-16 ENCOUNTER — Telehealth: Payer: Self-pay

## 2020-06-16 NOTE — Telephone Encounter (Signed)
Called patient and informed patient that we could move her to 07/13/2020 and COVID test on 07/11/2020. Patient verbalized understanding. Called Trish and informed her of the change she will move her on the scheduled

## 2020-06-16 NOTE — Telephone Encounter (Signed)
Called patient again and left a detail explaining to patient she needed a COVID test by 11:00am today or we would need to rescheduled her colonoscopy.

## 2020-06-16 NOTE — Telephone Encounter (Signed)
Called and left a detail message informing patient that she has not went for a COVID test for her colonoscopy on 06/17/2020. Informed patient that she had to go for COVID test today before 11:00am or her procedure would be cancel. Sent mychart message also

## 2020-07-04 ENCOUNTER — Encounter: Payer: Self-pay | Admitting: Adult Health

## 2020-07-05 ENCOUNTER — Other Ambulatory Visit: Payer: Self-pay

## 2020-07-05 DIAGNOSIS — Z3201 Encounter for pregnancy test, result positive: Secondary | ICD-10-CM

## 2020-07-07 ENCOUNTER — Encounter: Payer: Self-pay | Admitting: Gastroenterology

## 2020-07-11 ENCOUNTER — Other Ambulatory Visit: Payer: Medicaid Other | Attending: Gastroenterology

## 2020-07-12 ENCOUNTER — Other Ambulatory Visit: Payer: Self-pay | Admitting: Obstetrics and Gynecology

## 2020-07-12 ENCOUNTER — Ambulatory Visit (INDEPENDENT_AMBULATORY_CARE_PROVIDER_SITE_OTHER): Payer: Medicaid Other | Admitting: Obstetrics and Gynecology

## 2020-07-12 ENCOUNTER — Other Ambulatory Visit: Payer: Self-pay

## 2020-07-12 ENCOUNTER — Encounter: Payer: Self-pay | Admitting: Obstetrics and Gynecology

## 2020-07-12 ENCOUNTER — Other Ambulatory Visit (HOSPITAL_COMMUNITY)
Admission: RE | Admit: 2020-07-12 | Discharge: 2020-07-12 | Disposition: A | Discharge status: Home / Self Care | Payer: Medicaid Other | Source: Ambulatory Visit | Attending: Obstetrics and Gynecology | Admitting: Obstetrics and Gynecology

## 2020-07-12 VITALS — BP 130/90 | Wt 190.0 lb

## 2020-07-12 DIAGNOSIS — Z3480 Encounter for supervision of other normal pregnancy, unspecified trimester: Secondary | ICD-10-CM

## 2020-07-12 DIAGNOSIS — E039 Hypothyroidism, unspecified: Secondary | ICD-10-CM

## 2020-07-12 DIAGNOSIS — Z124 Encounter for screening for malignant neoplasm of cervix: Secondary | ICD-10-CM | POA: Insufficient documentation

## 2020-07-12 DIAGNOSIS — Z113 Encounter for screening for infections with a predominantly sexual mode of transmission: Secondary | ICD-10-CM | POA: Diagnosis present

## 2020-07-12 DIAGNOSIS — N912 Amenorrhea, unspecified: Secondary | ICD-10-CM | POA: Insufficient documentation

## 2020-07-12 DIAGNOSIS — O34219 Maternal care for unspecified type scar from previous cesarean delivery: Secondary | ICD-10-CM

## 2020-07-12 DIAGNOSIS — O99280 Endocrine, nutritional and metabolic diseases complicating pregnancy, unspecified trimester: Secondary | ICD-10-CM

## 2020-07-12 DIAGNOSIS — O99320 Drug use complicating pregnancy, unspecified trimester: Secondary | ICD-10-CM | POA: Insufficient documentation

## 2020-07-12 DIAGNOSIS — Z3689 Encounter for other specified antenatal screening: Secondary | ICD-10-CM

## 2020-07-12 DIAGNOSIS — Z8759 Personal history of other complications of pregnancy, childbirth and the puerperium: Secondary | ICD-10-CM

## 2020-07-12 DIAGNOSIS — R0989 Other specified symptoms and signs involving the circulatory and respiratory systems: Secondary | ICD-10-CM | POA: Insufficient documentation

## 2020-07-12 DIAGNOSIS — O0990 Supervision of high risk pregnancy, unspecified, unspecified trimester: Secondary | ICD-10-CM

## 2020-07-12 DIAGNOSIS — O09529 Supervision of elderly multigravida, unspecified trimester: Secondary | ICD-10-CM

## 2020-07-12 LAB — POCT URINE PREGNANCY: Preg Test, Ur: POSITIVE — AB

## 2020-07-12 NOTE — Progress Notes (Signed)
New Obstetric Patient H&P    Chief Complaint: "Desires prenatal care"   History of Present Illness: Patient is a 39 y.o. G1P0 Not Hispanic or Latino female, presents with amenorrhea and positive home pregnancy test. Patient's last menstrual period was 05/31/2020. and based on her  LMP, her EDD is Estimated Date of Delivery: 03/07/21 and her EGA is [redacted]w[redacted]d.    She had a urine pregnancy test which was positive 6 days ago. Since her LMP she claims she has experienced nausea, fatigue, breast tenderness. She denies vaginal bleeding. Her past medical history is contibutory substance abuse. Her prior pregnancies are notable for one prior emergency C-section 2006 [facility] for placental abruption and IUFD.  Since her LMP, she admits to the use of tobacco products  yes There are cats in the home in the home  no She admits close contact with children on a regular basis  yes  She has had chicken pox in the past yes She has had Tuberculosis exposures, symptoms, or previously tested positive for TB   no Current or past history of domestic violence. no  Genetic Screening/Teratology Counseling: (Includes patient, baby's father, or anyone in either family with:)   1. Patient's age >/= 67 at [facility]  yes 2. Thalassemia ([place], [place], Mediterranean, or Asian background): MCV<80  no 3. Neural tube defect (meningomyelocele, spina bifida, anencephaly)  no 4. Congenital heart defect  no  5. Down syndrome  no 6. Tay-Sachs (Jewish, [place]  no 7. Canavan's Disease  no 8. Sickle cell disease or trait (African)  no  9. Hemophilia or other blood disorders  no  10. Muscular dystrophy  no  11. Cystic fibrosis  no  12. Huntington's Chorea  no  13. Mental retardation/autism  no 14. Other inherited genetic or chromosomal disorder  no 15. Maternal metabolic disorder (DM, PKU, etc)  no 16. Patient or FOB with a child with a birth defect not listed above no  16a. Patient or FOB with a birth defect themselves  no 17. Recurrent pregnancy loss, or stillbirth  YES IUFD 38. Any medications since LMP other than prenatal vitamins (include vitamins, supplements, OTC meds, drugs, alcohol)  no 19. Any other genetic/environmental exposure to discuss  no  Infection History:   1. Lives with someone with TB or TB exposed  no  2. Patient or partner has history of genital herpes  no 3. Rash or viral illness since LMP  no 4. History of STI (GC, CT, HPV, syphilis, HIV)  Yes remote history of chlamydia and gonorrhea  Other pertinent information:  yes cocaine positive UDS 04/27/2020    Review of Systems:10 point review of systems negative unless otherwise noted in HPI  Past Medical History:  Patient Active Problem List   Diagnosis Date Noted  . Blood pressure elevated without history of HTN 04/27/2020  . Screening for lipid disorders 04/27/2020  . Marijuana use 04/27/2020  . Nausea 04/27/2020  . Fatigue 04/27/2020  . Wheezing 04/27/2020  . Hashimoto's thyroiditis 04/27/2020  . Anxiety 04/27/2020  . Body mass index 29.0-29.9, adult 04/27/2020  . MDD (major depressive disorder), recurrent episode, moderate (HCC) 08/19/2019  . Other specified hypothyroidism 07/14/2018    Past Surgical History:  No past surgical history on file.  Gynecologic History: Patient's last menstrual period was 05/31/2020.  Obstetric History: G1P0  Family History:  No family history on file.  Social History:  Social History   Socioeconomic History  . Marital status: Married    Spouse name: Not  on file  . Number of children: Not on file  . Years of education: Not on file  . Highest education level: Not on file  Occupational History  . Not on file  Tobacco Use  . Smoking status: Never Smoker  . Smokeless tobacco: Never Used  Vaping Use  . Vaping Use: Every day  Substance and Sexual Activity  . Alcohol use: Not Currently  . Drug use: Never  . Sexual activity: Not on file  Other Topics Concern  . Not on file   Social History Narrative  . Not on file   Social Determinants of Health   Financial Resource Strain:   . Difficulty of Paying Living Expenses:   Food Insecurity:   . Worried About Programme researcher, broadcasting/film/video in the Last Year:   . Barista in the Last Year:   Transportation Needs:   . Freight forwarder (Medical):   Marland Kitchen Lack of Transportation (Non-Medical):   Physical Activity:   . Days of Exercise per Week:   . Minutes of Exercise per Session:   Stress:   . Feeling of Stress :   Social Connections:   . Frequency of Communication with Friends and Family:   . Frequency of Social Gatherings with Friends and Family:   . Attends Religious Services:   . Active Member of Clubs or Organizations:   . Attends Banker Meetings:   Marland Kitchen Marital Status:   Intimate Partner Violence:   . Fear of Current or Ex-Partner:   . Emotionally Abused:   Marland Kitchen Physically Abused:   . Sexually Abused:     Allergies:  No Known Allergies  Medications: Prior to Admission medications   Medication Sig Start Date End Date Taking? Authorizing Provider  ARIPiprazole (ABILIFY) 2 MG tablet Take 2 mg by mouth daily.    [provider]  Blood Pressure Monitoring (BLOOD PRESSURE MONITOR/L CUFF) MISC 1 Device by Does not apply route once as needed for up to 1 dose. Monitor blood pressure brachial ( upper arm) once daily and keep log. 05/06/20   Flinchum, Eula Fried, FNP  buPROPion (WELLBUTRIN SR) 150 MG 12 hr tablet Take 1 tablet (150 mg total) by mouth 2 (two) times daily. Start 150 mg daily for one week and then can increase as above. 04/27/20   Flinchum, Eula Fried, FNP  cetirizine (ZYRTEC) 10 MG tablet Take 1 tablet (10 mg total) by mouth daily. For seasonal allergies Patient not taking: Reported on 06/01/2020 05/05/20   Flinchum, Eula Fried, FNP  fluticasone (FLONASE) 50 MCG/ACT nasal spray Place 1 spray into both nostrils daily. For seasonal allergies Patient not taking: Reported on 06/01/2020  05/05/20   Flinchum, Eula Fried, FNP  hydrOXYzine (ATARAX/VISTARIL) 50 MG tablet Take 1 tablet (50 mg total) by mouth every 8 (eight) hours as needed for anxiety or nausea (WILL CAUSE DROWSINESS, DO NOT DRIVE, NO ALCOHOL or other sedatives, NO LEGAL DECISIONS.). Patient not taking: Reported on 06/01/2020 05/05/20   Flinchum, Eula Fried, FNP  levothyroxine (SYNTHROID) 75 MCG tablet Take 1 tablet (75 mcg total) by mouth daily. 06/02/20   Flinchum, Eula Fried, FNP  lubiprostone (AMITIZA) 24 MCG capsule Take 1 capsule (24 mcg total) by mouth 2 (two) times daily with a meal. 05/25/20   Vanga, Loel Dubonnet, MD  Misc. Devices (PULSE OXIMETER) MISC 1 Device by Does not apply route daily as needed. Monitor heart rate and oxygen saturation once daily and keep log. Leave on finger for one minute  before recording. 05/06/20   Flinchum, Eula Fried, FNP  ondansetron (ZOFRAN) 4 MG tablet Take 1 tablet (4 mg total) by mouth every 8 (eight) hours as needed for nausea or vomiting. 06/01/20   Flinchum, Eula Fried, FNP  pantoprazole (PROTONIX) 40 MG tablet Take 1 tablet (40 mg total) by mouth daily. 06/01/20 08/30/20  Flinchum, Eula Fried, FNP  psyllium (HYDROCIL/METAMUCIL) 95 % PACK Take 1 packet by mouth daily. Mix with a full 8 ounces of water stir and drink for constipation. 05/05/20   Flinchum, Eula Fried, FNP  Vitamin D, Ergocalciferol, (DRISDOL) 1.25 MG (50000 UNIT) CAPS capsule Take 1 capsule (50,000 Units total) by mouth every 7 (seven) days. (taking one tablet per week) 04/28/20   Flinchum, Eula Fried, FNP    Physical Exam Vitals: Blood pressure 130/90, weight 190 lb (86.2 kg), last menstrual period 05/31/2020. Last menstrual period 05/31/2020.  General: NAD HEENT: normocephalic, anicteric Thyroid: no enlargement, no palpable nodules Pulmonary: No increased work of breathing, CTAB Cardiovascular: RRR, distal pulses 2+ Abdomen: NABS, soft, non-tender, non-distended.  Umbilicus without lesions.  No hepatomegaly, splenomegaly  or masses palpable. No evidence of hernia  Genitourinary:  External: Normal external female genitalia.  Normal urethral meatus, normal  Bartholin's and Skene's glands.    Vagina: Normal vaginal mucosa, no evidence of prolapse.    Cervix: Grossly normal in appearance, no bleeding  Uterus:  Non-enlarged, mobile, normal contour.  No CMT  Adnexa: ovaries non-enlarged, no adnexal masses  Rectal: deferred Extremities: no edema, erythema, or tenderness Neurologic: Grossly intact Psychiatric: mood appropriate, affect full   Assessment: 39 y.o. G1P0 at [redacted]w[redacted]d presenting to initiate prenatal care  Pregnancy#11 Problems (from 05/31/20 to present)    Problem Noted Resolved   Supervision of high risk pregnancy, antepartum 07/12/2020 by Vena Austria, MD No   Substance abuse affecting pregnancy, antepartum 07/12/2020 by Vena [place], MD No   History of cesarean delivery, antepartum 07/12/2020 by Vena [place], MD No   History of IUFD 07/12/2020 by Vena [place], MD No   Labile blood pressure 07/12/2020 by Vena [place], MD No   History of placental abruption 07/12/2020 by Vena Austria, MD No   Advanced maternal age in multigravida, unspecified trimester 07/12/2020 by Vena [place], MD No   Hypothyroidism affecting pregnancy, antepartum 07/14/2018 by Berniece Pap, FNP No       Plan: 1) Avoid alcoholic beverages. 2) Patient encouraged not to smoke.  3) Discontinue the use of all non-medicinal drugs and chemicals.  4) Take prenatal vitamins daily.  5) Nutrition, food safety (fish, cheese advisories, and high nitrite foods) and exercise discussed. 6) Hospital and practice style discussed with cross coverage system.  7) Genetic Screening, such as with 1st Trimester Screening, cell free fetal DNA, AFP testing, and Ultrasound, as well as with amniocentesis and CVS as appropriate, is discussed with patient. At the conclusion of today's visit patient requested genetic  testing 8) Baseline Preeclampsia labs along with repeat thyroid labs (See [facility]) 9) Was scheduled for colonoscopy tomorrow.  Discussed with GI with recommendation to await confirmation of viability and if still deemed necessary to proceed outside of first trimester or defer to the postpartum period  Vena Austria, MD, [facility], [facility] 07/12/2020, 1:23 PM

## 2020-07-12 NOTE — Progress Notes (Signed)
[facility] Referred by [facility] Pt has Hoshimoto's disease

## 2020-07-13 ENCOUNTER — Ambulatory Visit: Admission: RE | Admit: 2020-07-13 | Payer: Medicaid Other | Source: Home / Self Care | Admitting: Gastroenterology

## 2020-07-13 ENCOUNTER — Encounter: Admission: RE | Payer: Self-pay | Source: Home / Self Care

## 2020-07-13 LAB — COMPREHENSIVE METABOLIC PANEL
ALT: 8 IU/L (ref 0–32)
AST: 10 IU/L (ref 0–40)
Albumin/Globulin Ratio: 1.4 (ref 1.2–2.2)
Albumin: 4.1 g/dL (ref 3.8–4.8)
Alkaline Phosphatase: 47 IU/L — ABNORMAL LOW (ref 48–121)
BUN/Creatinine Ratio: 10 (ref 9–23)
BUN: 7 mg/dL (ref 6–20)
Bilirubin Total: <0.2 mg/dL (ref 0.0–1.2)
CO2: 21 mmol/L (ref 20–29)
Calcium: 8.6 mg/dL — ABNORMAL LOW (ref 8.7–10.2)
Chloride: 105 mmol/L (ref 96–106)
Creatinine, Ser: 0.71 mg/dL (ref 0.57–1.00)
GFR calc Af Amer: 126 mL/min/{1.73_m2} (ref >59)
GFR calc non Af Amer: 109 mL/min/{1.73_m2} (ref >59)
Globulin, Total: 3 g/dL (ref 1.5–4.5)
Glucose: 80 mg/dL (ref 65–99)
Potassium: 4.1 mmol/L (ref 3.5–5.2)
Sodium: 137 mmol/L (ref 134–144)
Total Protein: 7.1 g/dL (ref 6.0–8.5)

## 2020-07-13 LAB — RPR+RH+ABO+RUB AB+AB SCR+CB...
Antibody Screen: NEGATIVE
HIV Screen 4th Generation wRfx: NONREACTIVE
Hematocrit: 34.1 % (ref 34.0–46.6)
Hemoglobin: 11.2 g/dL (ref 11.1–15.9)
Hepatitis B Surface Ag: NEGATIVE
MCH: 26.7 pg (ref 26.6–33.0)
MCHC: 32.8 g/dL (ref 31.5–35.7)
MCV: 81 fL (ref 79–97)
Platelets: 374 10*3/uL (ref 150–450)
RBC: 4.2 x10E6/uL (ref 3.77–5.28)
RDW: 13.3 % (ref 11.7–15.4)
RPR Ser Ql: NONREACTIVE
Rh Factor: POSITIVE
Rubella Antibodies, IGG: 2.22 index (ref >0.99)
Varicella zoster IgG: 570 index (ref >165)
WBC: 6.5 10*3/uL (ref 3.4–10.8)

## 2020-07-13 LAB — THYROID PANEL WITH TSH
Free Thyroxine Index: 1 — ABNORMAL LOW (ref 1.2–4.9)
T3 Uptake Ratio: 23 % — ABNORMAL LOW (ref 24–39)
T4, Total: 4.3 ug/dL — ABNORMAL LOW (ref 4.5–12.0)
TSH: 12.8 u[IU]/mL — ABNORMAL HIGH (ref 0.450–4.500)

## 2020-07-13 SURGERY — COLONOSCOPY WITH PROPOFOL
Anesthesia: General

## 2020-07-14 LAB — URINE CULTURE

## 2020-07-15 LAB — CYTOLOGY - PAP
Chlamydia: NEGATIVE
Comment: NEGATIVE
Comment: NEGATIVE
Comment: NORMAL
Diagnosis: NEGATIVE
High risk HPV: NEGATIVE
Neisseria Gonorrhea: NEGATIVE

## 2020-07-22 ENCOUNTER — Ambulatory Visit: Payer: Medicaid Other

## 2020-07-22 ENCOUNTER — Encounter: Payer: Medicaid Other | Admitting: Obstetrics

## 2020-07-25 ENCOUNTER — Ambulatory Visit (INDEPENDENT_AMBULATORY_CARE_PROVIDER_SITE_OTHER): Payer: Medicaid Other

## 2020-07-25 ENCOUNTER — Other Ambulatory Visit: Payer: Self-pay

## 2020-07-25 ENCOUNTER — Other Ambulatory Visit: Payer: Self-pay | Admitting: Obstetrics and Gynecology

## 2020-07-25 ENCOUNTER — Ambulatory Visit (INDEPENDENT_AMBULATORY_CARE_PROVIDER_SITE_OTHER): Payer: Medicaid Other | Admitting: Obstetrics

## 2020-07-25 VITALS — BP 120/80 | Wt 189.0 lb

## 2020-07-25 DIAGNOSIS — O219 Vomiting of pregnancy, unspecified: Secondary | ICD-10-CM

## 2020-07-25 DIAGNOSIS — Z3A01 Less than 8 weeks gestation of pregnancy: Secondary | ICD-10-CM

## 2020-07-25 DIAGNOSIS — Z3689 Encounter for other specified antenatal screening: Secondary | ICD-10-CM | POA: Diagnosis not present

## 2020-07-25 DIAGNOSIS — Z3480 Encounter for supervision of other normal pregnancy, unspecified trimester: Secondary | ICD-10-CM

## 2020-07-25 DIAGNOSIS — O0990 Supervision of high risk pregnancy, unspecified, unspecified trimester: Secondary | ICD-10-CM

## 2020-07-25 DIAGNOSIS — F1290 Cannabis use, unspecified, uncomplicated: Secondary | ICD-10-CM

## 2020-07-25 DIAGNOSIS — Z3481 Encounter for supervision of other normal pregnancy, first trimester: Secondary | ICD-10-CM | POA: Diagnosis not present

## 2020-07-25 DIAGNOSIS — O99320 Drug use complicating pregnancy, unspecified trimester: Secondary | ICD-10-CM

## 2020-07-25 LAB — POCT URINALYSIS DIPSTICK OB
Glucose, UA: NEGATIVE
POC,PROTEIN,UA: NEGATIVE

## 2020-07-25 MED ORDER — ONDANSETRON 4 MG PO TBDP: 4.0000 mg | ORAL_TABLET | Freq: Four times a day (QID) | ORAL | 0 refills | Status: DC | PRN

## 2020-07-25 NOTE — Progress Notes (Signed)
  Routine Prenatal Care Visit  Subjective  Pamela Mooney is a 39 y.o. [id number] at [redacted]w[redacted]d being seen today for ongoing prenatal care.  She is currently monitored for the following issues for this high-risk pregnancy and has MDD (major depressive disorder), recurrent episode, moderate (HCC); Hypothyroidism affecting pregnancy, antepartum; Blood pressure elevated without history of HTN; Marijuana use; Hashimoto's thyroiditis; Anxiety; Body mass index 29.0-29.9, adult; Supervision of high risk pregnancy, antepartum; Substance abuse affecting pregnancy, antepartum; History of cesarean delivery, antepartum; History of IUFD; Labile blood pressure; Amenorrhea; History of placental abruption; and Advanced maternal age in multigravida, unspecified trimester on their problem list.  ----------------------------------------------------------------------------------- Patient reports no complaints.  She continues to vape and is using MJ to treat her nausea.  . Vag. Bleeding: None.   . Leaking Fluid denies.  ----------------------------------------------------------------------------------- The following portions of the patient's history were reviewed and updated as appropriate: allergies, current medications, past family history, past medical history, past social history, past surgical history and problem list. Problem list updated.  Objective  Blood pressure 120/80, weight 189 lb (85.7 kg), last menstrual period 05/31/2020. Pregravid weight 192 lb (87.1 kg) Total Weight Gain -3 lb (-1.361 kg) Urinalysis: Urine Protein    Urine Glucose    Fetal Status:           General:  Alert, oriented and cooperative. Patient is in no acute distress.  Skin: Skin is warm and dry. No rash noted.   Cardiovascular: Normal heart rate noted  Respiratory: Normal respiratory effort, no problems with respiration noted  Abdomen: Soft, gravid, appropriate for gestational age. Pain/Pressure: Absent     Pelvic:  Cervical exam deferred         Extremities: Normal range of motion.     Mental Status: Normal mood and affect. Normal behavior. Normal judgment and thought content.   Assessment   39 y.o. [id number] at [redacted]w[redacted]d by  03/07/2021, by Last Menstrual Period presenting for routine prenatal visit  Plan   Pregnancy#11 Problems (from 05/31/20 to present)    Problem Noted Resolved   Supervision of high risk pregnancy, antepartum 07/12/2020 by Vena [place], MD No   Substance abuse affecting pregnancy, antepartum 07/12/2020 by Vena [place], MD No   History of cesarean delivery, antepartum 07/12/2020 by Vena [place], MD No   History of IUFD 07/12/2020 by Vena [place], MD No   Labile blood pressure 07/12/2020 by Vena [place], MD No   History of placental abruption 07/12/2020 by Vena [place], MD No   Advanced maternal age in multigravida, unspecified trimester 07/12/2020 by Vena [place], MD No   Hypothyroidism affecting pregnancy, antepartum 07/14/2018 by Berniece Pap, FNP No       Preterm labor symptoms and general obstetric precautions including but not limited to vaginal bleeding, contractions, leaking of fluid and fetal movement were reviewed in detail with the patient. Please refer to After Visit Summary for other counseling recommendations.  Addressed the importance of avoiding the use of illegal substances including MJ, which she is using daily for nausea. Tobacco use also addressed (she vapes). Pamela Mooney is currently on daily Wellbutrin- encouraged her to conitnue with counseling and f/u on medication. Dating sono reviewed with her and the FOB. Urine drug screen retrieved today. Consider MFM for poly substance use if UDS is positive.  Return in about 4 weeks (around 08/22/2020) for return OB.  Pamela Mooney, CNM  07/25/2020 10:10 AM

## 2020-07-26 LAB — PAIN MGT SCRN (14 DRUGS), UR
Amphetamine Scrn, Ur: NEGATIVE ng/mL
BARBITURATE SCREEN URINE: NEGATIVE ng/mL
BENZODIAZEPINE SCREEN, URINE: POSITIVE ng/mL — AB
Buprenorphine, Urine: POSITIVE ng/mL — AB
CANNABINOIDS UR QL SCN: POSITIVE ng/mL — AB
Cocaine (Metab) Scrn, Ur: POSITIVE ng/mL — AB
Creatinine(Crt), U: 93.8 mg/dL (ref 20.0–300.0)
Fentanyl, Urine: NEGATIVE pg/mL
Meperidine Screen, Urine: NEGATIVE ng/mL
Methadone Screen, Urine: NEGATIVE ng/mL
OXYCODONE+OXYMORPHONE UR QL SCN: NEGATIVE ng/mL
Opiate Scrn, Ur: NEGATIVE ng/mL
Ph of Urine: 6.4 (ref 4.5–8.9)
Phencyclidine Qn, Ur: NEGATIVE ng/mL
Propoxyphene Scrn, Ur: NEGATIVE ng/mL
Tramadol Screen, Urine: NEGATIVE ng/mL

## 2020-07-30 ENCOUNTER — Other Ambulatory Visit: Payer: Self-pay

## 2020-07-30 ENCOUNTER — Emergency Department: Payer: Medicaid Other

## 2020-07-30 ENCOUNTER — Emergency Department
Admission: EM | Admit: 2020-07-30 | Discharge: 2020-07-30 | Disposition: A | Discharge status: Home / Self Care | Payer: Medicaid Other | Attending: Emergency Medicine | Admitting: Emergency Medicine

## 2020-07-30 DIAGNOSIS — O209 Hemorrhage in early pregnancy, unspecified: Secondary | ICD-10-CM | POA: Insufficient documentation

## 2020-07-30 DIAGNOSIS — O4690 Antepartum hemorrhage, unspecified, unspecified trimester: Secondary | ICD-10-CM

## 2020-07-30 DIAGNOSIS — Z3A08 8 weeks gestation of pregnancy: Secondary | ICD-10-CM | POA: Insufficient documentation

## 2020-07-30 LAB — URINALYSIS, COMPLETE (UACMP) WITH MICROSCOPIC
Bacteria, UA: NONE SEEN
Bilirubin Urine: NEGATIVE
Glucose, UA: NEGATIVE mg/dL
Ketones, ur: NEGATIVE mg/dL
Leukocytes,Ua: NEGATIVE
Nitrite: NEGATIVE
Protein, ur: NEGATIVE mg/dL
Specific Gravity, Urine: 1.018 (ref 1.005–1.030)
pH: 7 (ref 5.0–8.0)

## 2020-07-30 LAB — CBC
HCT: 35.7 % — ABNORMAL LOW (ref 36.0–46.0)
Hemoglobin: 12 g/dL (ref 12.0–15.0)
MCH: 26.7 pg (ref 26.0–34.0)
MCHC: 33.6 g/dL (ref 30.0–36.0)
MCV: 79.5 fL — ABNORMAL LOW (ref 80.0–100.0)
Platelets: 371 10*3/uL (ref 150–400)
RBC: 4.49 MIL/uL (ref 3.87–5.11)
RDW: 14.6 % (ref 11.5–15.5)
WBC: 7.2 10*3/uL (ref 4.0–10.5)
nRBC: 0 % (ref 0.0–0.2)

## 2020-07-30 LAB — ABO/RH: ABO/RH(D): O POS

## 2020-07-30 LAB — COMPREHENSIVE METABOLIC PANEL
ALT: 11 U/L (ref 0–44)
AST: 14 U/L — ABNORMAL LOW (ref 15–41)
Albumin: 4 g/dL (ref 3.5–5.0)
Alkaline Phosphatase: 36 U/L — ABNORMAL LOW (ref 38–126)
Anion gap: 8 (ref 5–15)
BUN: 10 mg/dL (ref 6–20)
CO2: 24 mmol/L (ref 22–32)
Calcium: 9 mg/dL (ref 8.9–10.3)
Chloride: 104 mmol/L (ref 98–111)
Creatinine, Ser: 0.56 mg/dL (ref 0.44–1.00)
GFR calc Af Amer: >60 mL/min (ref >60)
GFR calc non Af Amer: >60 mL/min (ref >60)
Glucose, Bld: 84 mg/dL (ref 70–99)
Potassium: 4.2 mmol/L (ref 3.5–5.1)
Sodium: 136 mmol/L (ref 135–145)
Total Bilirubin: 0.6 mg/dL (ref 0.3–1.2)
Total Protein: 7.8 g/dL (ref 6.5–8.1)

## 2020-07-30 LAB — HCG, QUANTITATIVE, PREGNANCY: hCG, Beta Chain, Quant, S: 183028 m[IU]/mL — ABNORMAL HIGH (ref <5)

## 2020-07-30 NOTE — ED Notes (Signed)
Pt refusing the ABO/Rh blood test. Pt stating she wants to get it done at [facility] office. NP made aware.

## 2020-07-30 NOTE — ED Notes (Signed)
Pt called for repeat vs x 1.

## 2020-07-30 NOTE — ED Triage Notes (Signed)
Pt arrives to ER c/o light red spotting this AM. States [redacted] weeks pregnant. States previous miscarriages. Had normal [place] 8 days ago. Sees [facility]. 11 pregnancies, 4 living children, 1 stillborn birth.

## 2020-07-30 NOTE — ED Provider Notes (Signed)
[facility] Emergency Department Provider Note  ____________________________________________  Time seen: Approximately 4:49 PM  I have reviewed the triage vital signs and the nursing notes.   HISTORY  Chief Complaint Vaginal Bleeding    HPI Pamela Mooney is a 39 y.o. female who presents to the emergency department for treatment and evaluation after having some spotting earlier today.  She is approximately [redacted] weeks pregnant.  Is a high risk pregnancy due to miscarriages.  She is G11, P5, A4, and 1 stillbirth. Spotting has stopped. She denies abdominal pain or cramping. She denies back pain.  Past Medical History:  Diagnosis Date  . Anxiety   . Depression   . Hashimoto's disease   . Thyroid disease     Patient Active Problem List   Diagnosis Date Noted  . Supervision of high risk pregnancy, antepartum 07/12/2020  . Substance abuse affecting pregnancy, antepartum 07/12/2020  . History of cesarean delivery, antepartum 07/12/2020  . History of IUFD 07/12/2020  . Labile blood pressure 07/12/2020  . Amenorrhea 07/12/2020  . History of placental abruption 07/12/2020  . Advanced maternal age in multigravida, unspecified trimester 07/12/2020  . Blood pressure elevated without history of HTN 04/27/2020  . Marijuana use 04/27/2020  . Hashimoto's thyroiditis 04/27/2020  . Anxiety 04/27/2020  . Body mass index 29.0-29.9, adult 04/27/2020  . MDD (major depressive disorder), recurrent episode, moderate (HCC) 08/19/2019  . Hypothyroidism affecting pregnancy, antepartum 07/14/2018    Past Surgical History:  Procedure Laterality Date  . CESAREAN SECTION  07/31/2005   Full term Stillborn    Prior to Admission medications   Medication Sig Start Date End Date Taking? Authorizing Provider  ARIPiprazole (ABILIFY) 2 MG tablet Take 2 mg by mouth daily.    [provider]  Blood Pressure Monitoring (BLOOD PRESSURE MONITOR/L CUFF) MISC 1 Device by Does not  apply route once as needed for up to 1 dose. Monitor blood pressure brachial ( upper arm) once daily and keep log. 05/06/20   Flinchum, Eula Fried, FNP  buPROPion (WELLBUTRIN) 100 MG tablet Take 300 mg by mouth once.    [provider]  fluticasone (FLONASE) 50 MCG/ACT nasal spray Place 1 spray into both nostrils daily. For seasonal allergies 05/05/20   Flinchum, Eula Fried, FNP  lubiprostone (AMITIZA) 24 MCG capsule Take 1 capsule (24 mcg total) by mouth 2 (two) times daily with a meal. 05/25/20   Vanga, Loel Dubonnet, MD  Misc. Devices (PULSE OXIMETER) MISC 1 Device by Does not apply route daily as needed. Monitor heart rate and oxygen saturation once daily and keep log. Leave on finger for one minute before recording. 05/06/20   Flinchum, Eula Fried, FNP  ondansetron (ZOFRAN) 4 MG tablet Take 1 tablet (4 mg total) by mouth every 8 (eight) hours as needed for nausea or vomiting. 06/01/20   Flinchum, Eula Fried, FNP  pantoprazole (PROTONIX) 40 MG tablet Take 1 tablet (40 mg total) by mouth daily. 06/01/20 08/30/20  Flinchum, Eula Fried, FNP  psyllium (HYDROCIL/METAMUCIL) 95 % PACK Take 1 packet by mouth daily. Mix with a full 8 ounces of water stir and drink for constipation. 05/05/20   Flinchum, Eula Fried, FNP  Vitamin D, Ergocalciferol, (DRISDOL) 1.25 MG (50000 UNIT) CAPS capsule Take 1 capsule (50,000 Units total) by mouth every 7 (seven) days. (taking one tablet per week) 04/28/20   Flinchum, Eula Fried, FNP    Allergies Patient has no known allergies.  History reviewed. No pertinent family history.  Social History Social  History   Tobacco Use  . Smoking status: Never Smoker  . Smokeless tobacco: Never Used  Vaping Use  . Vaping Use: Every day  Substance Use Topics  . Alcohol use: Not Currently  . Drug use: Never    Review of Systems Constitutional: Negative for fever. Respiratory: Negative for shortness of breath or cough. Gastrointestinal: Negative for abdominal pain; negative for  nausea , negative for vomiting. Genitourinary: Negative for dysuria , negative for vaginal discharge. Musculoskeletal: Negataive for back pain. Skin: Negative for acute skin changes/rash/lesion. ____________________________________________   PHYSICAL EXAM:  VITAL SIGNS: ED Triage Vitals  Enc Vitals Group     BP 07/30/20 1402 119/89     Pulse Rate 07/30/20 1402 70     Resp 07/30/20 1402 18     Temp 07/30/20 1402 98.7 F (37.1 C)     Temp Source 07/30/20 1402 Oral     SpO2 07/30/20 1402 99 %     Weight 07/30/20 1403 182 lb (82.6 kg)     Height 07/30/20 1403 5\' 7"  (1.702 m)     Head Circumference --      Peak Flow --      Pain Score 07/30/20 1402 2     Pain Loc --      Pain Edu? --      Excl. in GC? --     Constitutional: Alert and oriented. Well appearing and in no acute distress. Eyes: Conjunctivae are normal. Head: Atraumatic. Nose: No congestion/rhinnorhea. Mouth/Throat: Mucous membranes are moist. Respiratory: Normal respiratory effort.  No retractions. Gastrointestinal: Bowel sounds active x 4; Abdomen is soft without rebound or guarding. Genitourinary: Pelvic exam: deferred Musculoskeletal: No extremity tenderness nor edema.  Neurologic:  Normal speech and language. No gross focal neurologic deficits are appreciated. Speech is normal. No gait instability. Skin:  Skin is warm, dry and intact. No rash noted on exposed skin. Psychiatric: Mood and affect are normal. Speech and behavior are normal.  ____________________________________________   LABS (all labs ordered are listed, but only abnormal results are displayed)  Labs Reviewed  COMPREHENSIVE METABOLIC PANEL - Abnormal; Notable for the following components:      Result Value   AST 14 (*)    Alkaline Phosphatase 36 (*)    All other components within normal limits  CBC - Abnormal; Notable for the following components:   HCT 35.7 (*)    MCV 79.5 (*)    All other components within normal limits  URINALYSIS,  COMPLETE (UACMP) WITH MICROSCOPIC - Abnormal; Notable for the following components:   Color, Urine YELLOW (*)    APPearance HAZY (*)    Hgb urine dipstick MODERATE (*)    All other components within normal limits  HCG, QUANTITATIVE, PREGNANCY - Abnormal; Notable for the following components:   hCG, Beta Chain, Quant, S 09/29/20 (*)    All other components within normal limits  ABO/RH   ____________________________________________  RADIOLOGY  Single IUP, 8 weeks and 1 day. EDC 03/10/21 ____________________________________________  Procedures  ____________________________________________   INITIAL IMPRESSION / ASSESSMENT AND PLAN / ED COURSE  39 year old female presenting to the emergency department for treatment and evaluation after experiencing some vaginal spotting earlier today.  See HPI for further details.  Plan will be to get an ultrasound.  Beta hCG is reassuring at 183,028.  No indication of acute cystitis based on urinalysis results.  Ultrasound shows a single IUP 8 weeks and 1 day with a fetal heart rate of 178.  No subchorionic hemorrhage  is noted.  Patient refused ABO and Rh blood draw.  She states that she knows that she is O+ and does not want to be stuck again for additional blood work.  She states that she has never had to have RhoGam injections in the past and she wants to have her OB/GYN to do the testing if he feels that it is indicated.  Rationale for the testing discussed with the patient who again says that she is O+ and will not need RhoGam.  She is advised to call her OB/GYN for follow-up.  For symptoms of concern if unable to see her gynecologist she is to return to the emergency department.  Pertinent labs & imaging results that were available during my care of the patient were reviewed by me and considered in my medical decision making (see chart for details).  ____________________________________________   FINAL CLINICAL IMPRESSION(S) / ED  DIAGNOSES  Final diagnoses:  Vaginal bleeding in pregnancy    Note:  This document was prepared using Dragon voice recognition software and may include unintentional dictation errors.   Chinita Pester, FNP 07/30/20 1847    Phineas Semen, MD 07/30/20 Ebony Cargo

## 2020-07-30 NOTE — Discharge Instructions (Addendum)
8 weeks 1 day   [facility] 03/10/21

## 2020-08-03 NOTE — Telephone Encounter (Signed)
She saw Pamela Mooney last and she can really see any provider for follow up to discuss

## 2020-08-04 ENCOUNTER — Encounter: Payer: Medicaid Other | Admitting: Obstetrics and Gynecology

## 2020-08-05 ENCOUNTER — Other Ambulatory Visit: Payer: Self-pay | Admitting: Obstetrics

## 2020-08-05 DIAGNOSIS — R110 Nausea: Secondary | ICD-10-CM

## 2020-08-05 MED ORDER — ONDANSETRON HCL 4 MG PO TABS: 4.0000 mg | ORAL_TABLET | Freq: Three times a day (TID) | ORAL | 1 refills | Status: DC | PRN

## 2020-08-05 NOTE — Progress Notes (Signed)
Patient contacted the office requesting a refill on hre Zofran. Rx renewed. Mirna Mires, CNM  08/05/2020 1:18 PM

## 2020-08-05 NOTE — Telephone Encounter (Signed)
Can you refill

## 2020-08-09 ENCOUNTER — Other Ambulatory Visit: Payer: Self-pay | Admitting: Obstetrics

## 2020-08-09 DIAGNOSIS — O219 Vomiting of pregnancy, unspecified: Secondary | ICD-10-CM

## 2020-08-09 DIAGNOSIS — O0990 Supervision of high risk pregnancy, unspecified, unspecified trimester: Secondary | ICD-10-CM

## 2020-08-09 MED ORDER — ONDANSETRON 4 MG PO TBDP: 4.0000 mg | ORAL_TABLET | Freq: Four times a day (QID) | ORAL | 0 refills | Status: DC | PRN

## 2020-08-09 NOTE — Progress Notes (Signed)
Called patient by phone to address her UDS results. She is presently under the care of a psychiatrist, swho hadnles her psych meds. She is aware of the highre risk to her pregnancy, and shares that she stopped using any cocaine once she knew she was pregnant.Strongly encouraged to continue with professional help and offered additional assistance with treatment. She has her next appointment with Dr. Bonney Aid for ROB and she will continue this "dialogue" with him.  I also renewed a RX for the Zofran ODT for her.  Mirna Mires, CNM  08/09/2020 5:35 PM

## 2020-08-11 ENCOUNTER — Encounter: Payer: Medicaid Other | Admitting: Obstetrics and Gynecology

## 2020-08-23 ENCOUNTER — Encounter: Payer: Medicaid Other | Admitting: Obstetrics

## 2020-08-23 ENCOUNTER — Other Ambulatory Visit: Payer: Self-pay

## 2020-08-24 ENCOUNTER — Encounter: Payer: Self-pay | Admitting: Advanced Practice Midwife

## 2020-08-24 ENCOUNTER — Other Ambulatory Visit: Payer: Self-pay

## 2020-08-24 ENCOUNTER — Ambulatory Visit (INDEPENDENT_AMBULATORY_CARE_PROVIDER_SITE_OTHER): Payer: Medicaid Other | Admitting: Advanced Practice Midwife

## 2020-08-24 VITALS — BP 143/90 | Wt 188.0 lb

## 2020-08-24 DIAGNOSIS — O0991 Supervision of high risk pregnancy, unspecified, first trimester: Secondary | ICD-10-CM

## 2020-08-24 DIAGNOSIS — O99320 Drug use complicating pregnancy, unspecified trimester: Secondary | ICD-10-CM

## 2020-08-24 DIAGNOSIS — F1910 Other psychoactive substance abuse, uncomplicated: Secondary | ICD-10-CM

## 2020-08-24 DIAGNOSIS — O0990 Supervision of high risk pregnancy, unspecified, unspecified trimester: Secondary | ICD-10-CM

## 2020-08-24 DIAGNOSIS — O219 Vomiting of pregnancy, unspecified: Secondary | ICD-10-CM

## 2020-08-24 DIAGNOSIS — Z3A12 12 weeks gestation of pregnancy: Secondary | ICD-10-CM

## 2020-08-24 DIAGNOSIS — O09529 Supervision of elderly multigravida, unspecified trimester: Secondary | ICD-10-CM

## 2020-08-24 MED ORDER — ONDANSETRON 4 MG PO TBDP: 4.0000 mg | ORAL_TABLET | Freq: Four times a day (QID) | ORAL | 1 refills | Status: DC | PRN

## 2020-08-24 NOTE — Progress Notes (Signed)
  Routine Prenatal Care Visit  Subjective  AMI MALLY is a 39 y.o. [id number] at [redacted]w[redacted]d being seen today for ongoing prenatal care.  She is currently monitored for the following issues for this high-risk pregnancy and has MDD (major depressive disorder), recurrent episode, moderate (HCC); Hypothyroidism affecting pregnancy, antepartum; Blood pressure elevated without history of HTN; Marijuana use; Hashimoto's thyroiditis; Anxiety; Body mass index 29.0-29.9, adult; Supervision of high risk pregnancy, antepartum; Substance abuse affecting pregnancy, antepartum; History of cesarean delivery, antepartum; History of IUFD; Labile blood pressure; Amenorrhea; History of placental abruption; and Advanced maternal age in multigravida, unspecified trimester on their problem list.  ----------------------------------------------------------------------------------- Patient reports ongoing nausea and vomiting. She needs a refill of zofran ODT.    . Vag. Bleeding: None.   . Leaking Fluid denies.  ----------------------------------------------------------------------------------- The following portions of the patient's history were reviewed and updated as appropriate: allergies, current medications, past family history, past medical history, past social history, past surgical history and problem list. Problem list updated.  Objective  Blood pressure (!) 143/90, weight 188 lb (85.3 kg), last menstrual period 05/31/2020. Repeat BP: 136/83  Pregravid weight 192 lb (87.1 kg) Total Weight Gain -4 lb (-1.814 kg) Urinalysis: Urine Protein    Urine Glucose    Fetal Status: Fetal Heart Rate (bpm): 172         General:  Alert, oriented and cooperative. Patient is in no acute distress.  Skin: Skin is warm and dry. No rash noted.   Cardiovascular: Normal heart rate noted  Respiratory: Normal respiratory effort, no problems with respiration noted  Abdomen: Soft, gravid, appropriate for gestational age.       Pelvic:   Cervical exam deferred        Extremities: Normal range of motion.     Mental Status: Normal mood and affect. Normal behavior. Normal judgment and thought content.   Assessment   39 y.o. [id number] at [redacted]w[redacted]d by  03/07/2021, by Last Menstrual Period presenting for routine prenatal visit  Plan   Pregnancy#11 Problems (from 05/31/20 to present)    Problem Noted Resolved   Supervision of high risk pregnancy, antepartum 07/12/2020 by Vena Austria, MD No   Substance abuse affecting pregnancy, antepartum 07/12/2020 by Vena Austria, MD No   Overview Addendum 07/25/2020 12:31 PM by Mirna Mires, CNM    07/25/2020 Using MJ during pregnancy.  UDS retrieved at this visit. Tested + for cocaine in May 2021      Previous Version   History of cesarean delivery, antepartum 07/12/2020 by Vena Austria, MD No   History of IUFD 07/12/2020 by Vena Austria, MD No   Labile blood pressure 07/12/2020 by Vena Austria, MD No   History of placental abruption 07/12/2020 by Vena Austria, MD No   Advanced maternal age in multigravida, unspecified trimester 07/12/2020 by Vena Austria, MD No   Hypothyroidism affecting pregnancy, antepartum 07/14/2018 by Berniece Pap, FNP No    UDS today MaterniT 21 today (wants results in envelope)   Preterm labor symptoms and general obstetric precautions including but not limited to vaginal bleeding, contractions, leaking of fluid and fetal movement were reviewed in detail with the patient. Please refer to After Visit Summary for other counseling recommendations.   Return in about 4 weeks (around 09/21/2020) for rob.  Tresea Mall, CNM 08/24/2020 4:34 PM

## 2020-08-24 NOTE — Progress Notes (Signed)
ROB Repeat B/P 136/83

## 2020-08-25 ENCOUNTER — Ambulatory Visit: Payer: Medicaid Other | Admitting: Gastroenterology

## 2020-08-29 LAB — MATERNIT 21 PLUS CORE, BLOOD
Fetal Fraction: 11
Result (T21): NEGATIVE
Trisomy 13 (Patau syndrome): NEGATIVE
Trisomy 18 (Edwards syndrome): NEGATIVE
Trisomy 21 (Down syndrome): NEGATIVE

## 2020-09-02 LAB — URINE DRUG PANEL 7
Amphetamines, Urine: NEGATIVE ng/mL
Barbiturate Quant, Ur: NEGATIVE ng/mL
Benzodiazepine Quant, Ur: POSITIVE — AB
Cannabinoid Quant, Ur: POSITIVE — AB
Cocaine (Metab.): POSITIVE — AB
Opiate Quant, Ur: NEGATIVE ng/mL
PCP Quant, Ur: NEGATIVE ng/mL

## 2020-09-06 ENCOUNTER — Other Ambulatory Visit: Payer: Self-pay | Admitting: Obstetrics

## 2020-09-06 DIAGNOSIS — O219 Vomiting of pregnancy, unspecified: Secondary | ICD-10-CM

## 2020-09-06 DIAGNOSIS — O0990 Supervision of high risk pregnancy, unspecified, unspecified trimester: Secondary | ICD-10-CM

## 2020-09-06 MED ORDER — ONDANSETRON 4 MG PO TBDP: 4.0000 mg | ORAL_TABLET | Freq: Four times a day (QID) | ORAL | 1 refills | Status: DC | PRN

## 2020-09-19 ENCOUNTER — Encounter: Payer: Medicaid Other | Admitting: Obstetrics and Gynecology

## 2020-10-07 ENCOUNTER — Other Ambulatory Visit: Payer: Self-pay | Admitting: Advanced Practice Midwife

## 2020-10-07 DIAGNOSIS — O219 Vomiting of pregnancy, unspecified: Secondary | ICD-10-CM

## 2020-10-07 DIAGNOSIS — O0990 Supervision of high risk pregnancy, unspecified, unspecified trimester: Secondary | ICD-10-CM

## 2020-10-07 MED ORDER — ONDANSETRON 4 MG PO TBDP: 4.0000 mg | ORAL_TABLET | Freq: Four times a day (QID) | ORAL | 2 refills | Status: DC | PRN

## 2020-10-07 NOTE — Progress Notes (Signed)
Refill zofran per patient request.

## 2020-10-08 ENCOUNTER — Encounter: Payer: Self-pay | Admitting: Adult Health

## 2020-10-08 ENCOUNTER — Other Ambulatory Visit: Payer: Self-pay | Admitting: Adult Health

## 2020-10-08 NOTE — Telephone Encounter (Signed)
Med was dc 'd on 07/12/20 "error". Pt is due for f/u blood work.  Med is not on active med list- pt is out of med.

## 2020-10-10 ENCOUNTER — Other Ambulatory Visit: Payer: Self-pay | Admitting: Adult Health

## 2020-10-10 ENCOUNTER — Encounter: Payer: Self-pay | Admitting: Adult Health

## 2020-10-10 DIAGNOSIS — E039 Hypothyroidism, unspecified: Secondary | ICD-10-CM

## 2020-10-10 NOTE — Telephone Encounter (Signed)
Patient is overdue for follow up and labs - needs follow up - maybe one morning fasting so we can touch base and get labs same day.   Thank you,  Marvell Fuller MSN, AGNP-C, FNP-C  Family Nurse Practitioner  Adult Geriatric Nurse Practitioner

## 2020-10-10 NOTE — Progress Notes (Signed)
Labs ordered needs repeat TSH. Is pregnant. May need endocrinology referral.

## 2020-10-12 ENCOUNTER — Other Ambulatory Visit: Payer: Self-pay | Admitting: Adult Health

## 2020-10-12 DIAGNOSIS — Z3201 Encounter for pregnancy test, result positive: Secondary | ICD-10-CM

## 2020-10-12 DIAGNOSIS — E039 Hypothyroidism, unspecified: Secondary | ICD-10-CM

## 2020-10-12 DIAGNOSIS — E063 Autoimmune thyroiditis: Secondary | ICD-10-CM

## 2020-10-12 NOTE — Progress Notes (Signed)
Orders Placed This Encounter  Procedures  . Ambulatory referral to Endocrinology    Referral Priority:   Urgent    Referral Type:   Consultation    Referral Reason:   Specialty Services Required    Number of Visits Requested:   1

## 2020-10-15 LAB — CBC WITH DIFFERENTIAL
Basophils Absolute: 0.1 10*3/uL (ref 0.0–0.2)
Basos: 1 %
EOS (ABSOLUTE): 0.2 10*3/uL (ref 0.0–0.4)
Eos: 2 %
Hematocrit: 31.8 % — ABNORMAL LOW (ref 34.0–46.6)
Hemoglobin: 10.3 g/dL — ABNORMAL LOW (ref 11.1–15.9)
Immature Grans (Abs): 0 10*3/uL (ref 0.0–0.1)
Immature Granulocytes: 0 %
Lymphocytes Absolute: 1.8 10*3/uL (ref 0.7–3.1)
Lymphs: 21 %
MCH: 26.7 pg (ref 26.6–33.0)
MCHC: 32.4 g/dL (ref 31.5–35.7)
MCV: 82 fL (ref 79–97)
Monocytes Absolute: 0.5 10*3/uL (ref 0.1–0.9)
Monocytes: 6 %
Neutrophils Absolute: 6 10*3/uL (ref 1.4–7.0)
Neutrophils: 70 %
RBC: 3.86 x10E6/uL (ref 3.77–5.28)
RDW: 16.6 % — ABNORMAL HIGH (ref 11.7–15.4)
WBC: 8.6 10*3/uL (ref 3.4–10.8)

## 2020-10-15 LAB — COMPREHENSIVE METABOLIC PANEL
ALT: 5 IU/L (ref 0–32)
AST: 8 IU/L (ref 0–40)
Albumin/Globulin Ratio: 1 — ABNORMAL LOW (ref 1.2–2.2)
Albumin: 3.1 g/dL — ABNORMAL LOW (ref 3.8–4.8)
Alkaline Phosphatase: 48 IU/L (ref 44–121)
BUN/Creatinine Ratio: 11 (ref 9–23)
BUN: 6 mg/dL (ref 6–20)
Bilirubin Total: <0.2 mg/dL (ref 0.0–1.2)
CO2: 20 mmol/L (ref 20–29)
Calcium: 8.4 mg/dL — ABNORMAL LOW (ref 8.7–10.2)
Chloride: 103 mmol/L (ref 96–106)
Creatinine, Ser: 0.53 mg/dL — ABNORMAL LOW (ref 0.57–1.00)
GFR calc Af Amer: 139 mL/min/{1.73_m2} (ref >59)
GFR calc non Af Amer: 121 mL/min/{1.73_m2} (ref >59)
Globulin, Total: 3 g/dL (ref 1.5–4.5)
Glucose: 65 mg/dL (ref 65–99)
Potassium: 4 mmol/L (ref 3.5–5.2)
Sodium: 134 mmol/L (ref 134–144)
Total Protein: 6.1 g/dL (ref 6.0–8.5)

## 2020-10-15 LAB — TSH: TSH: 20.6 u[IU]/mL — ABNORMAL HIGH (ref 0.450–4.500)

## 2020-10-17 ENCOUNTER — Encounter: Payer: Self-pay | Admitting: Adult Health

## 2020-10-18 ENCOUNTER — Encounter: Payer: Medicaid Other | Admitting: Obstetrics and Gynecology

## 2020-11-01 ENCOUNTER — Telehealth: Payer: Self-pay

## 2020-11-01 ENCOUNTER — Encounter: Payer: Self-pay | Admitting: Adult Health

## 2020-11-01 NOTE — Telephone Encounter (Signed)
FYI. KW 

## 2020-11-01 NOTE — Telephone Encounter (Signed)
Provider sent [facility] message to patient regarding this and urge her to follow up with her OBGYN and her endocrinologist as soon as possible.

## 2020-11-01 NOTE — Telephone Encounter (Signed)
Copied from CRM [id number]. Topic: Appointment Scheduling - Scheduling Inquiry for Clinic >> Nov 01, 2020 10:42 AM Marylen Ponto wrote: Reason for CRM: Brandy with [facility] stated pt has no showed or cancelled 6 appts but they will continue to try to reach pt to get an appt scheduled. Brandy advised if pt no shows again she will be dismissed. Cb# [phone]

## 2020-11-22 ENCOUNTER — Telehealth: Payer: Self-pay

## 2020-11-22 NOTE — Telephone Encounter (Signed)
Patient is requesting for her anatomy scan ultrasound so she can be seen altogether for this visit. Please advise

## 2020-11-22 NOTE — Telephone Encounter (Signed)
Please call pt and schedule ROB appointment

## 2020-11-23 NOTE — Telephone Encounter (Signed)
She has not been seen in 3 months she can do both together but she just needs to get in to clinic

## 2020-11-24 NOTE — Telephone Encounter (Signed)
Called and left voicemail for patient to call back to be scheduled. 

## 2020-11-29 ENCOUNTER — Encounter: Payer: Medicaid Other | Admitting: Advanced Practice Midwife

## 2020-12-02 ENCOUNTER — Encounter: Payer: Medicaid Other | Admitting: Obstetrics and Gynecology

## 2020-12-06 ENCOUNTER — Encounter: Payer: Self-pay | Admitting: Advanced Practice Midwife

## 2020-12-06 ENCOUNTER — Ambulatory Visit (INDEPENDENT_AMBULATORY_CARE_PROVIDER_SITE_OTHER): Payer: Medicaid Other | Admitting: Advanced Practice Midwife

## 2020-12-06 ENCOUNTER — Other Ambulatory Visit: Payer: Self-pay

## 2020-12-06 VITALS — BP 136/82 | Wt 184.0 lb

## 2020-12-06 DIAGNOSIS — Z130 Encounter for screening for diseases of the blood and blood-forming organs and certain disorders involving the immune mechanism: Secondary | ICD-10-CM

## 2020-12-06 DIAGNOSIS — Z3A27 27 weeks gestation of pregnancy: Secondary | ICD-10-CM

## 2020-12-06 DIAGNOSIS — Z113 Encounter for screening for infections with a predominantly sexual mode of transmission: Secondary | ICD-10-CM

## 2020-12-06 DIAGNOSIS — O0992 Supervision of high risk pregnancy, unspecified, second trimester: Secondary | ICD-10-CM

## 2020-12-06 DIAGNOSIS — O99320 Drug use complicating pregnancy, unspecified trimester: Secondary | ICD-10-CM

## 2020-12-06 DIAGNOSIS — Z131 Encounter for screening for diabetes mellitus: Secondary | ICD-10-CM

## 2020-12-06 DIAGNOSIS — O09529 Supervision of elderly multigravida, unspecified trimester: Secondary | ICD-10-CM

## 2020-12-06 NOTE — Progress Notes (Signed)
  Routine Prenatal Care Visit  Subjective  Pamela Mooney is a 39 y.o. [id number] at [redacted]w[redacted]d being seen today for ongoing prenatal care.  She is currently monitored for the following issues for this high-risk pregnancy and has MDD (major depressive disorder), recurrent episode, moderate (HCC); Hypothyroidism affecting pregnancy, antepartum; Blood pressure elevated without history of HTN; Marijuana use; Hashimoto's thyroiditis; Anxiety; Body mass index 29.0-29.9, adult; Supervision of high risk pregnancy, antepartum; Substance abuse affecting pregnancy, antepartum; History of cesarean delivery, antepartum; History of IUFD; Labile blood pressure; Amenorrhea; History of placental abruption; and Advanced maternal age in multigravida, unspecified trimester on their problem list.  ----------------------------------------------------------------------------------- Patient reports missing visits due to various circumstances. She had follow up TSH and increase in thyroid medication dose. Her moods are fairly stable.  Has not had anatomy scan yet or 28 week labs.   . Vag. Bleeding: None.  Movement: Present. Leaking Fluid denies.  ----------------------------------------------------------------------------------- The following portions of the patient's history were reviewed and updated as appropriate: allergies, current medications, past family history, past medical history, past social history, past surgical history and problem list. Problem list updated.  Objective  Blood pressure 136/82, weight 184 lb (83.5 kg), last menstrual period 05/31/2020. Pregravid weight 192 lb (87.1 kg) Total Weight Gain -8 lb (-3.629 kg) Urinalysis: Urine Protein    Urine Glucose    Fetal Status: Fetal Heart Rate (bpm): 152 Fundal Height: 28 cm Movement: Present     General:  Alert, oriented and cooperative. Patient is in no acute distress.  Skin: Skin is warm and dry. No rash noted.   Cardiovascular: Normal heart rate noted   Respiratory: Normal respiratory effort, no problems with respiration noted  Abdomen: Soft, gravid, appropriate for gestational age. Pain/Pressure: Absent     Pelvic:  Cervical exam deferred        Extremities: Normal range of motion.  Edema: None  Mental Status: Normal mood and affect. Normal behavior. Normal judgment and thought content.   Assessment   39 y.o. [id number] at [redacted]w[redacted]d by  03/07/2021, by Last Menstrual Period presenting for routine prenatal visit  Plan   Pregnancy#11 Problems (from 05/31/20 to present)    Problem Noted Resolved   Supervision of high risk pregnancy, antepartum 07/12/2020 by Vena Austria, MD No   Substance abuse affecting pregnancy, antepartum 07/12/2020 by Vena Austria, MD No   Overview Addendum 07/25/2020 12:31 PM by Mirna Mires, CNM    07/25/2020 Using MJ during pregnancy.  UDS retrieved at this visit. Tested + for cocaine in May 2021      Previous Version   History of cesarean delivery, antepartum 07/12/2020 by Vena Austria, MD No   History of IUFD 07/12/2020 by Vena Austria, MD No   Labile blood pressure 07/12/2020 by Vena Austria, MD No   History of placental abruption 07/12/2020 by Vena Austria, MD No   Advanced maternal age in multigravida, unspecified trimester 07/12/2020 by Vena Austria, MD No   Hypothyroidism affecting pregnancy, antepartum 07/14/2018 by Berniece Pap, FNP No       Preterm labor symptoms and general obstetric precautions including but not limited to vaginal bleeding, contractions, leaking of fluid and fetal movement were reviewed in detail with the patient. Please refer to After Visit Summary for other counseling recommendations.   28 week labs and UDS today Anatomy scan in 1 week  Return in about 1 week (around 12/13/2020) for anatomy scan and rob.  Tresea Mall, CNM 12/06/2020 2:35 PM

## 2020-12-06 NOTE — Patient Instructions (Signed)
Third Trimester of Pregnancy The third trimester is from week 28 through week 40 (months 7 through 9). The third trimester is a time when the unborn baby (fetus) is growing rapidly. At the end of the ninth month, the fetus is about 20 inches in length and weighs 6-10 pounds. Body changes during your third trimester Your body will continue to go through many changes during pregnancy. The changes vary from woman to woman. During the third trimester:  Your weight will continue to increase. You can expect to gain 25-35 pounds (11-16 kg) by the end of the pregnancy.  You may begin to get stretch marks on your hips, abdomen, and breasts.  You may urinate more often because the fetus is moving lower into your pelvis and pressing on your bladder.  You may develop or continue to have heartburn. This is caused by increased hormones that slow down muscles in the digestive tract.  You may develop or continue to have constipation because increased hormones slow digestion and cause the muscles that push waste through your intestines to relax.  You may develop hemorrhoids. These are swollen veins (varicose veins) in the rectum that can itch or be painful.  You may develop swollen, bulging veins (varicose veins) in your legs.  You may have increased body aches in the pelvis, back, or thighs. This is due to weight gain and increased hormones that are relaxing your joints.  You may have changes in your hair. These can include thickening of your hair, rapid growth, and changes in texture. Some women also have hair loss during or after pregnancy, or hair that feels dry or thin. Your hair will most likely return to normal after your baby is born.  Your breasts will continue to grow and they will continue to become tender. A yellow fluid (colostrum) may leak from your breasts. This is the first milk you are producing for your baby.  Your belly button may stick out.  You may notice more swelling in your hands,  face, or ankles.  You may have increased tingling or numbness in your hands, arms, and legs. The skin on your belly may also feel numb.  You may feel short of breath because of your expanding uterus.  You may have more problems sleeping. This can be caused by the size of your belly, increased need to urinate, and an increase in your body's metabolism.  You may notice the fetus "dropping," or moving lower in your abdomen (lightening).  You may have increased vaginal discharge.  You may notice your joints feel loose and you may have pain around your pelvic bone. What to expect at prenatal visits You will have prenatal exams every 2 weeks until week 36. Then you will have weekly prenatal exams. During a routine prenatal visit:  You will be weighed to make sure you and the baby are growing normally.  Your blood pressure will be taken.  Your abdomen will be measured to track your baby's growth.  The fetal heartbeat will be listened to.  Any test results from the previous visit will be discussed.  You may have a cervical check near your due date to see if your cervix has softened or thinned (effaced).  You will be tested for Group B streptococcus. This happens between 35 and 37 weeks. Your health care provider may ask you:  What your birth plan is.  How you are feeling.  If you are feeling the baby move.  If you have had any abnormal   symptoms, such as leaking fluid, bleeding, severe headaches, or abdominal cramping.  If you are using any tobacco products, including cigarettes, chewing tobacco, and electronic cigarettes.  If you have any questions. Other tests or screenings that may be performed during your third trimester include:  Blood tests that check for low iron levels (anemia).  Fetal testing to check the health, activity level, and growth of the fetus. Testing is done if you have certain medical conditions or if there are problems during the pregnancy.  Nonstress test  (NST). This test checks the health of your baby to make sure there are no signs of problems, such as the baby not getting enough oxygen. During this test, a belt is placed around your belly. The baby is made to move, and its heart rate is monitored during movement. What is false labor? False labor is a condition in which you feel small, irregular tightenings of the muscles in the womb (contractions) that usually go away with rest, changing position, or drinking water. These are called Braxton Hicks contractions. Contractions may last for hours, days, or even weeks before true labor sets in. If contractions come at regular intervals, become more frequent, increase in intensity, or become painful, you should see your health care provider. What are the signs of labor?  Abdominal cramps.  Regular contractions that start at 10 minutes apart and become stronger and more frequent with time.  Contractions that start on the top of the uterus and spread down to the lower abdomen and back.  Increased pelvic pressure and dull back pain.  A watery or bloody mucus discharge that comes from the vagina.  Leaking of amniotic fluid. This is also known as your "water breaking." It could be a slow trickle or a gush. Let your health care provider know if it has a color or strange odor. If you have any of these signs, call your health care provider right away, even if it is before your due date. Follow these instructions at home: Medicines  Follow your health care provider's instructions regarding medicine use. Specific medicines may be either safe or unsafe to take during pregnancy.  Take a prenatal vitamin that contains at least 600 micrograms (mcg) of folic acid.  If you develop constipation, try taking a stool softener if your health care provider approves. Eating and drinking   Eat a balanced diet that includes fresh fruits and vegetables, whole grains, good sources of protein such as meat, eggs, or tofu,  and low-fat dairy. Your health care provider will help you determine the amount of weight gain that is right for you.  Avoid raw meat and uncooked cheese. These carry germs that can cause birth defects in the baby.  If you have low calcium intake from food, talk to your health care provider about whether you should take a daily calcium supplement.  Eat four or five small meals rather than three large meals a day.  Limit foods that are high in fat and processed sugars, such as fried and sweet foods.  To prevent constipation: ? Drink enough fluid to keep your urine clear or pale yellow. ? Eat foods that are high in fiber, such as fresh fruits and vegetables, whole grains, and beans. Activity  Exercise only as directed by your health care provider. Most women can continue their usual exercise routine during pregnancy. Try to exercise for 30 minutes at least 5 days a week. Stop exercising if you experience uterine contractions.  Avoid heavy lifting.  Do   not exercise in extreme heat or humidity, or at high altitudes.  Wear low-heel, comfortable shoes.  Practice good posture.  You may continue to have sex unless your health care provider tells you otherwise. Relieving pain and discomfort  Take frequent breaks and rest with your legs elevated if you have leg cramps or low back pain.  Take warm sitz baths to soothe any pain or discomfort caused by hemorrhoids. Use hemorrhoid cream if your health care provider approves.  Wear a good support bra to prevent discomfort from breast tenderness.  If you develop varicose veins: ? Wear support pantyhose or compression stockings as told by your healthcare provider. ? Elevate your feet for 15 minutes, 3-4 times a day. Prenatal care  Write down your questions. Take them to your prenatal visits.  Keep all your prenatal visits as told by your health care provider. This is important. Safety  Wear your seat belt at all times when driving.  Make  a list of emergency phone numbers, including numbers for family, friends, the hospital, and police and fire departments. General instructions  Avoid cat litter boxes and soil used by cats. These carry germs that can cause birth defects in the baby. If you have a cat, ask someone to clean the litter box for you.  Do not travel far distances unless it is absolutely necessary and only with the approval of your health care provider.  Do not use hot tubs, steam rooms, or saunas.  Do not drink alcohol.  Do not use any products that contain nicotine or tobacco, such as cigarettes and e-cigarettes. If you need help quitting, ask your health care provider.  Do not use any medicinal herbs or unprescribed drugs. These chemicals affect the formation and growth of the baby.  Do not douche or use tampons or scented sanitary pads.  Do not cross your legs for long periods of time.  To prepare for the arrival of your baby: ? Take prenatal classes to understand, practice, and ask questions about labor and delivery. ? Make a trial run to the hospital. ? Visit the hospital and tour the maternity area. ? Arrange for maternity or paternity leave through employers. ? Arrange for family and friends to take care of pets while you are in the hospital. ? Purchase a rear-facing car seat and make sure you know how to install it in your car. ? Pack your hospital bag. ? Prepare the baby's nursery. Make sure to remove all pillows and stuffed animals from the baby's crib to prevent suffocation.  Visit your dentist if you have not gone during your pregnancy. Use a soft toothbrush to brush your teeth and be gentle when you floss. Contact a health care provider if:  You are unsure if you are in labor or if your water has broken.  You become dizzy.  You have mild pelvic cramps, pelvic pressure, or nagging pain in your abdominal area.  You have lower back pain.  You have persistent nausea, vomiting, or  diarrhea.  You have an unusual or bad smelling vaginal discharge.  You have pain when you urinate. Get help right away if:  Your water breaks before 37 weeks.  You have regular contractions less than 5 minutes apart before 37 weeks.  You have a fever.  You are leaking fluid from your vagina.  You have spotting or bleeding from your vagina.  You have severe abdominal pain or cramping.  You have rapid weight loss or weight gain.  You have   shortness of breath with chest pain.  You notice sudden or extreme swelling of your face, hands, ankles, feet, or legs.  Your baby makes fewer than 10 movements in 2 hours.  You have severe headaches that do not go away when you take medicine.  You have vision changes. Summary  The third trimester is from week 28 through week 40, months 7 through 9. The third trimester is a time when the unborn baby (fetus) is growing rapidly.  During the third trimester, your discomfort may increase as you and your baby continue to gain weight. You may have abdominal, leg, and back pain, sleeping problems, and an increased need to urinate.  During the third trimester your breasts will keep growing and they will continue to become tender. A yellow fluid (colostrum) may leak from your breasts. This is the first milk you are producing for your baby.  False labor is a condition in which you feel small, irregular tightenings of the muscles in the womb (contractions) that eventually go away. These are called Braxton Hicks contractions. Contractions may last for hours, days, or even weeks before true labor sets in.  Signs of labor can include: abdominal cramps; regular contractions that start at 10 minutes apart and become stronger and more frequent with time; watery or bloody mucus discharge that comes from the vagina; increased pelvic pressure and dull back pain; and leaking of amniotic fluid. This information is not intended to replace advice given to you by your  health care provider. Make sure you discuss any questions you have with your health care provider. Document Revised: 04/02/2019 Document Reviewed: 01/15/2017 Elsevier Patient Education  2020 [employer]  

## 2020-12-06 NOTE — Progress Notes (Signed)
ROB - no concerns. RM 5 

## 2020-12-07 LAB — 28 WEEK RH+PANEL
Basophils Absolute: 0.1 10*3/uL (ref 0.0–0.2)
Basos: 1 %
EOS (ABSOLUTE): 0.1 10*3/uL (ref 0.0–0.4)
Eos: 1 %
Gestational Diabetes Screen: 85 mg/dL (ref 65–139)
HIV Screen 4th Generation wRfx: NONREACTIVE
Hematocrit: 32.3 % — ABNORMAL LOW (ref 34.0–46.6)
Hemoglobin: 10.6 g/dL — ABNORMAL LOW (ref 11.1–15.9)
Immature Grans (Abs): 0.1 10*3/uL (ref 0.0–0.1)
Immature Granulocytes: 1 %
Lymphocytes Absolute: 1.7 10*3/uL (ref 0.7–3.1)
Lymphs: 17 %
MCH: 27 pg (ref 26.6–33.0)
MCHC: 32.8 g/dL (ref 31.5–35.7)
MCV: 82 fL (ref 79–97)
Monocytes Absolute: 0.5 10*3/uL (ref 0.1–0.9)
Monocytes: 5 %
Neutrophils Absolute: 7.7 10*3/uL — ABNORMAL HIGH (ref 1.4–7.0)
Neutrophils: 75 %
Platelets: 266 10*3/uL (ref 150–450)
RBC: 3.93 x10E6/uL (ref 3.77–5.28)
RDW: 14.5 % (ref 11.7–15.4)
RPR Ser Ql: NONREACTIVE
WBC: 10.2 10*3/uL (ref 3.4–10.8)

## 2020-12-13 ENCOUNTER — Other Ambulatory Visit: Payer: Self-pay

## 2020-12-13 ENCOUNTER — Ambulatory Visit (INDEPENDENT_AMBULATORY_CARE_PROVIDER_SITE_OTHER): Payer: Medicaid Other | Admitting: Obstetrics and Gynecology

## 2020-12-13 ENCOUNTER — Ambulatory Visit (INDEPENDENT_AMBULATORY_CARE_PROVIDER_SITE_OTHER): Payer: Medicaid Other

## 2020-12-13 VITALS — BP 128/82 | Wt 187.0 lb

## 2020-12-13 DIAGNOSIS — Z3A27 27 weeks gestation of pregnancy: Secondary | ICD-10-CM | POA: Diagnosis not present

## 2020-12-13 DIAGNOSIS — O0993 Supervision of high risk pregnancy, unspecified, third trimester: Secondary | ICD-10-CM

## 2020-12-13 DIAGNOSIS — O99320 Drug use complicating pregnancy, unspecified trimester: Secondary | ICD-10-CM

## 2020-12-13 DIAGNOSIS — O0992 Supervision of high risk pregnancy, unspecified, second trimester: Secondary | ICD-10-CM | POA: Diagnosis not present

## 2020-12-13 DIAGNOSIS — O99283 Endocrine, nutritional and metabolic diseases complicating pregnancy, third trimester: Secondary | ICD-10-CM

## 2020-12-13 DIAGNOSIS — Z3A28 28 weeks gestation of pregnancy: Secondary | ICD-10-CM

## 2020-12-13 DIAGNOSIS — O09523 Supervision of elderly multigravida, third trimester: Secondary | ICD-10-CM

## 2020-12-13 DIAGNOSIS — E039 Hypothyroidism, unspecified: Secondary | ICD-10-CM

## 2020-12-13 DIAGNOSIS — Z369 Encounter for antenatal screening, unspecified: Secondary | ICD-10-CM

## 2020-12-13 LAB — POCT URINALYSIS DIPSTICK OB
Glucose, UA: NEGATIVE
POC,PROTEIN,UA: NEGATIVE

## 2020-12-13 MED ORDER — LEVOTHYROXINE SODIUM 125 MCG PO TABS: 125.0000 ug | ORAL_TABLET | Freq: Every day | ORAL | 1 refills | Status: DC

## 2020-12-13 NOTE — Progress Notes (Signed)
ROB - no concerns. RM 4 °

## 2020-12-13 NOTE — Progress Notes (Signed)
Routine Prenatal Care Visit  Subjective  Pamela Mooney is a 39 y.o. [id number] at [redacted]w[redacted]d being seen today for ongoing prenatal care.  She is currently monitored for the following issues for this high-risk pregnancy and has MDD (major depressive disorder), recurrent episode, moderate (HCC); Hypothyroidism affecting pregnancy, antepartum; Blood pressure elevated without history of HTN; Marijuana use; Hashimoto's thyroiditis; Anxiety; Body mass index 29.0-29.9, adult; Supervision of high risk pregnancy, antepartum; Substance abuse affecting pregnancy, antepartum; History of cesarean delivery, antepartum; History of IUFD; Labile blood pressure; Amenorrhea; History of placental abruption; and Advanced maternal age in multigravida, unspecified trimester on their problem list.  ----------------------------------------------------------------------------------- Patient reports no complaints.    .  .   . Denies leaking of fluid.  ----------------------------------------------------------------------------------- The following portions of the patient's history were reviewed and updated as appropriate: allergies, current medications, past family history, past medical history, past social history, past surgical history and problem list. Problem list updated.   Objective  Blood pressure 128/82, weight 187 lb (84.8 kg), last menstrual period 05/31/2020. Pregravid weight 192 lb (87.1 kg) Total Weight Gain -5 lb (-2.268 kg) Urinalysis:      Fetal Status:   Fundal Height: 29 cm       General:  Alert, oriented and cooperative. Patient is in no acute distress.  Skin: Skin is warm and dry. No rash noted.   Cardiovascular: Normal heart rate noted  Respiratory: Normal respiratory effort, no problems with respiration noted  Abdomen: Soft, gravid, appropriate for gestational age.       Pelvic:  Cervical exam deferred        Extremities: Normal range of motion.     ental Status: Normal mood and affect. Normal  behavior. Normal judgment and thought content.     Assessment   38 y.o. [id number] at [redacted]w[redacted]d by  03/07/2021, by Last Menstrual Period presenting for routine prenatal visit  Plan   Pregnancy#11 Problems (from 05/31/20 to present)    Problem Noted Resolved   Supervision of high risk pregnancy, antepartum 07/12/2020 by Vena Austria, MD No   Substance abuse affecting pregnancy, antepartum 07/12/2020 by Vena Austria, MD No   Overview Addendum 07/25/2020 12:31 PM by Mirna Mires, CNM    07/25/2020 Using MJ during pregnancy.  UDS retrieved at this visit. Tested + for cocaine in May 2021      Previous Version   History of cesarean delivery, antepartum 07/12/2020 by Vena Austria, MD No   History of IUFD 07/12/2020 by Vena Austria, MD No   Labile blood pressure 07/12/2020 by Vena Austria, MD No   History of placental abruption 07/12/2020 by Vena Austria, MD No   Advanced maternal age in multigravida, unspecified trimester 07/12/2020 by Vena Austria, MD No   Hypothyroidism affecting pregnancy, antepartum 07/14/2018 by Berniece Pap, FNP No       Gestational age appropriate obstetric precautions including but not limited to vaginal bleeding, contractions, leaking of fluid and fetal movement were reviewed in detail with the patient.    -Patient reports that she had a dose adjustment of her thyroid medication with the elevated TSH on 10/14/20 - was suppose to see PCP today for f/u but rescheduled that appointment. Thyroid panel ordered today.  -Hx of polysubstance abuse - patient reports only utilizing THC currently, states that she has been seeing a psychologist for management of depression/anxiety - UDS today   Return in about 1 week (around 12/20/2020) for HROB - can schedule weekly visits out (please schedule at  least 3).  Zipporah Plants, CNM, MSN [facility], [facility] 12/13/2020, 4:48 PM

## 2020-12-15 LAB — PMP SCREEN PROFILE (10S), URINE
Amphetamine Scrn, Ur: NEGATIVE ng/mL
BARBITURATE SCREEN URINE: NEGATIVE ng/mL
BENZODIAZEPINE SCREEN, URINE: POSITIVE ng/mL — AB
CANNABINOIDS UR QL SCN: POSITIVE ng/mL — AB
Cocaine (Metab) Scrn, Ur: NEGATIVE ng/mL
Creatinine(Crt), U: 112.4 mg/dL (ref 20.0–300.0)
Methadone Screen, Urine: NEGATIVE ng/mL
OXYCODONE+OXYMORPHONE UR QL SCN: NEGATIVE ng/mL
Opiate Scrn, Ur: NEGATIVE ng/mL
Ph of Urine: 7 (ref 4.5–8.9)
Phencyclidine Qn, Ur: NEGATIVE ng/mL
Propoxyphene Scrn, Ur: NEGATIVE ng/mL

## 2020-12-20 ENCOUNTER — Encounter: Payer: Self-pay | Admitting: Obstetrics and Gynecology

## 2020-12-20 ENCOUNTER — Ambulatory Visit (INDEPENDENT_AMBULATORY_CARE_PROVIDER_SITE_OTHER): Payer: Medicaid Other | Admitting: Obstetrics and Gynecology

## 2020-12-20 ENCOUNTER — Other Ambulatory Visit: Payer: Self-pay

## 2020-12-20 VITALS — BP 120/70 | Ht 67.0 in | Wt 188.6 lb

## 2020-12-20 DIAGNOSIS — R0602 Shortness of breath: Secondary | ICD-10-CM

## 2020-12-20 DIAGNOSIS — O26893 Other specified pregnancy related conditions, third trimester: Secondary | ICD-10-CM

## 2020-12-20 DIAGNOSIS — E039 Hypothyroidism, unspecified: Secondary | ICD-10-CM

## 2020-12-20 DIAGNOSIS — O34219 Maternal care for unspecified type scar from previous cesarean delivery: Secondary | ICD-10-CM

## 2020-12-20 DIAGNOSIS — O0990 Supervision of high risk pregnancy, unspecified, unspecified trimester: Secondary | ICD-10-CM

## 2020-12-20 DIAGNOSIS — Z8759 Personal history of other complications of pregnancy, childbirth and the puerperium: Secondary | ICD-10-CM

## 2020-12-20 DIAGNOSIS — O09529 Supervision of elderly multigravida, unspecified trimester: Secondary | ICD-10-CM

## 2020-12-20 DIAGNOSIS — O99280 Endocrine, nutritional and metabolic diseases complicating pregnancy, unspecified trimester: Secondary | ICD-10-CM

## 2020-12-20 NOTE — Patient Instructions (Signed)
Third Trimester of Pregnancy The third trimester is from week 28 through week 40 (months 7 through 9). The third trimester is a time when the unborn baby (fetus) is growing rapidly. At the end of the ninth month, the fetus is about 20 inches in length and weighs 6-10 pounds. Body changes during your third trimester Your body will continue to go through many changes during pregnancy. The changes vary from woman to woman. During the third trimester:  Your weight will continue to increase. You can expect to gain 25-35 pounds (11-16 kg) by the end of the pregnancy.  You may begin to get stretch marks on your hips, abdomen, and breasts.  You may urinate more often because the fetus is moving lower into your pelvis and pressing on your bladder.  You may develop or continue to have heartburn. This is caused by increased hormones that slow down muscles in the digestive tract.  You may develop or continue to have constipation because increased hormones slow digestion and cause the muscles that push waste through your intestines to relax.  You may develop hemorrhoids. These are swollen veins (varicose veins) in the rectum that can itch or be painful.  You may develop swollen, bulging veins (varicose veins) in your legs.  You may have increased body aches in the pelvis, back, or thighs. This is due to weight gain and increased hormones that are relaxing your joints.  You may have changes in your hair. These can include thickening of your hair, rapid growth, and changes in texture. Some women also have hair loss during or after pregnancy, or hair that feels dry or thin. Your hair will most likely return to normal after your baby is born.  Your breasts will continue to grow and they will continue to become tender. A yellow fluid (colostrum) may leak from your breasts. This is the first milk you are producing for your baby.  Your belly button may stick out.  You may notice more swelling in your hands,  face, or ankles.  You may have increased tingling or numbness in your hands, arms, and legs. The skin on your belly may also feel numb.  You may feel short of breath because of your expanding uterus.  You may have more problems sleeping. This can be caused by the size of your belly, increased need to urinate, and an increase in your body's metabolism.  You may notice the fetus "dropping," or moving lower in your abdomen (lightening).  You may have increased vaginal discharge.  You may notice your joints feel loose and you may have pain around your pelvic bone. What to expect at prenatal visits You will have prenatal exams every 2 weeks until week 36. Then you will have weekly prenatal exams. During a routine prenatal visit:  You will be weighed to make sure you and the baby are growing normally.  Your blood pressure will be taken.  Your abdomen will be measured to track your baby's growth.  The fetal heartbeat will be listened to.  Any test results from the previous visit will be discussed.  You may have a cervical check near your due date to see if your cervix has softened or thinned (effaced).  You will be tested for Group B streptococcus. This happens between 35 and 37 weeks. Your health care provider may ask you:  What your birth plan is.  How you are feeling.  If you are feeling the baby move.  If you have had any abnormal   symptoms, such as leaking fluid, bleeding, severe headaches, or abdominal cramping.  If you are using any tobacco products, including cigarettes, chewing tobacco, and electronic cigarettes.  If you have any questions. Other tests or screenings that may be performed during your third trimester include:  Blood tests that check for low iron levels (anemia).  Fetal testing to check the health, activity level, and growth of the fetus. Testing is done if you have certain medical conditions or if there are problems during the pregnancy.  Nonstress test  (NST). This test checks the health of your baby to make sure there are no signs of problems, such as the baby not getting enough oxygen. During this test, a belt is placed around your belly. The baby is made to move, and its heart rate is monitored during movement. What is false labor? False labor is a condition in which you feel small, irregular tightenings of the muscles in the womb (contractions) that usually go away with rest, changing position, or drinking water. These are called Braxton Hicks contractions. Contractions may last for hours, days, or even weeks before true labor sets in. If contractions come at regular intervals, become more frequent, increase in intensity, or become painful, you should see your health care provider. What are the signs of labor?  Abdominal cramps.  Regular contractions that start at 10 minutes apart and become stronger and more frequent with time.  Contractions that start on the top of the uterus and spread down to the lower abdomen and back.  Increased pelvic pressure and dull back pain.  A watery or bloody mucus discharge that comes from the vagina.  Leaking of amniotic fluid. This is also known as your "water breaking." It could be a slow trickle or a gush. Let your health care provider know if it has a color or strange odor. If you have any of these signs, call your health care provider right away, even if it is before your due date. Follow these instructions at home: Medicines  Follow your health care provider's instructions regarding medicine use. Specific medicines may be either safe or unsafe to take during pregnancy.  Take a prenatal vitamin that contains at least 600 micrograms (mcg) of folic acid.  If you develop constipation, try taking a stool softener if your health care provider approves. Eating and drinking   Eat a balanced diet that includes fresh fruits and vegetables, whole grains, good sources of protein such as meat, eggs, or tofu,  and low-fat dairy. Your health care provider will help you determine the amount of weight gain that is right for you.  Avoid raw meat and uncooked cheese. These carry germs that can cause birth defects in the baby.  If you have low calcium intake from food, talk to your health care provider about whether you should take a daily calcium supplement.  Eat four or five small meals rather than three large meals a day.  Limit foods that are high in fat and processed sugars, such as fried and sweet foods.  To prevent constipation: ? Drink enough fluid to keep your urine clear or pale yellow. ? Eat foods that are high in fiber, such as fresh fruits and vegetables, whole grains, and beans. Activity  Exercise only as directed by your health care provider. Most women can continue their usual exercise routine during pregnancy. Try to exercise for 30 minutes at least 5 days a week. Stop exercising if you experience uterine contractions.  Avoid heavy lifting.  Do   not exercise in extreme heat or humidity, or at high altitudes.  Wear low-heel, comfortable shoes.  Practice good posture.  You may continue to have sex unless your health care provider tells you otherwise. Relieving pain and discomfort  Take frequent breaks and rest with your legs elevated if you have leg cramps or low back pain.  Take warm sitz baths to soothe any pain or discomfort caused by hemorrhoids. Use hemorrhoid cream if your health care provider approves.  Wear a good support bra to prevent discomfort from breast tenderness.  If you develop varicose veins: ? Wear support pantyhose or compression stockings as told by your healthcare provider. ? Elevate your feet for 15 minutes, 3-4 times a day. Prenatal care  Write down your questions. Take them to your prenatal visits.  Keep all your prenatal visits as told by your health care provider. This is important. Safety  Wear your seat belt at all times when driving.  Make  a list of emergency phone numbers, including numbers for family, friends, the hospital, and police and fire departments. General instructions  Avoid cat litter boxes and soil used by cats. These carry germs that can cause birth defects in the baby. If you have a cat, ask someone to clean the litter box for you.  Do not travel far distances unless it is absolutely necessary and only with the approval of your health care provider.  Do not use hot tubs, steam rooms, or saunas.  Do not drink alcohol.  Do not use any products that contain nicotine or tobacco, such as cigarettes and e-cigarettes. If you need help quitting, ask your health care provider.  Do not use any medicinal herbs or unprescribed drugs. These chemicals affect the formation and growth of the baby.  Do not douche or use tampons or scented sanitary pads.  Do not cross your legs for long periods of time.  To prepare for the arrival of your baby: ? Take prenatal classes to understand, practice, and ask questions about labor and delivery. ? Make a trial run to the hospital. ? Visit the hospital and tour the maternity area. ? Arrange for maternity or paternity leave through employers. ? Arrange for family and friends to take care of pets while you are in the hospital. ? Purchase a rear-facing car seat and make sure you know how to install it in your car. ? Pack your hospital bag. ? Prepare the baby's nursery. Make sure to remove all pillows and stuffed animals from the baby's crib to prevent suffocation.  Visit your dentist if you have not gone during your pregnancy. Use a soft toothbrush to brush your teeth and be gentle when you floss. Contact a health care provider if:  You are unsure if you are in labor or if your water has broken.  You become dizzy.  You have mild pelvic cramps, pelvic pressure, or nagging pain in your abdominal area.  You have lower back pain.  You have persistent nausea, vomiting, or  diarrhea.  You have an unusual or bad smelling vaginal discharge.  You have pain when you urinate. Get help right away if:  Your water breaks before 37 weeks.  You have regular contractions less than 5 minutes apart before 37 weeks.  You have a fever.  You are leaking fluid from your vagina.  You have spotting or bleeding from your vagina.  You have severe abdominal pain or cramping.  You have rapid weight loss or weight gain.  You have   shortness of breath with chest pain.  You notice sudden or extreme swelling of your face, hands, ankles, feet, or legs.  Your baby makes fewer than 10 movements in 2 hours.  You have severe headaches that do not go away when you take medicine.  You have vision changes. Summary  The third trimester is from week 28 through week 40, months 7 through 9. The third trimester is a time when the unborn baby (fetus) is growing rapidly.  During the third trimester, your discomfort may increase as you and your baby continue to gain weight. You may have abdominal, leg, and back pain, sleeping problems, and an increased need to urinate.  During the third trimester your breasts will keep growing and they will continue to become tender. A yellow fluid (colostrum) may leak from your breasts. This is the first milk you are producing for your baby.  False labor is a condition in which you feel small, irregular tightenings of the muscles in the womb (contractions) that eventually go away. These are called Braxton Hicks contractions. Contractions may last for hours, days, or even weeks before true labor sets in.  Signs of labor can include: abdominal cramps; regular contractions that start at 10 minutes apart and become stronger and more frequent with time; watery or bloody mucus discharge that comes from the vagina; increased pelvic pressure and dull back pain; and leaking of amniotic fluid. This information is not intended to replace advice given to you by your  health care provider. Make sure you discuss any questions you have with your health care provider. Document Revised: 04/02/2019 Document Reviewed: 01/15/2017 Elsevier Patient Education  2020 [employer]  

## 2020-12-20 NOTE — Progress Notes (Signed)
Routine Prenatal Care Visit  Subjective  Pamela Mooney is a 39 y.o. [id number] at 102w0d being seen today for ongoing prenatal care.  She is currently monitored for the following issues for this high-risk pregnancy and has MDD (major depressive disorder), recurrent episode, moderate (HCC); Hypothyroidism affecting pregnancy, antepartum; Blood pressure elevated without history of HTN; Marijuana use; Hashimoto's thyroiditis; Anxiety; Body mass index 29.0-29.9, adult; Supervision of high risk pregnancy, antepartum; Substance abuse affecting pregnancy, antepartum; History of cesarean delivery, antepartum; History of IUFD; Labile blood pressure; Amenorrhea; History of placental abruption; and Advanced maternal age in multigravida, unspecified trimester on their problem list.  ----------------------------------------------------------------------------------- Patient reports fatigue.  She not SOB with minimal ambulation. Can not walk up steps without stopping multiple times. Reports she has a valvular heart condition as well.  Contractions: Irregular. Vag. Bleeding: None.  Movement: Present. Denies leaking of fluid.  ----------------------------------------------------------------------------------- The following portions of the patient's history were reviewed and updated as appropriate: allergies, current medications, past family history, past medical history, past social history, past surgical history and problem list. Problem list updated.   Objective  Blood pressure 120/70, height 5\' 7"  (1.702 m), weight 188 lb 9.6 oz (85.5 kg), last menstrual period 05/31/2020. Pregravid weight 192 lb (87.1 kg) Total Weight Gain -3 lb 6.4 oz (-1.542 kg) Urinalysis:      Fetal Status: Fetal Heart Rate (bpm): 130 Fundal Height: 29 cm Movement: Present     General:  Alert, oriented and cooperative. Patient is in no acute distress.  Skin: Skin is warm and dry. No rash noted.   Cardiovascular: Normal heart rate  noted  Respiratory: Normal respiratory effort, no problems with respiration noted  Abdomen: Soft, gravid, appropriate for gestational age. Pain/Pressure: Absent     Pelvic:  Cervical exam deferred        Extremities: Normal range of motion.  Edema: None  Mental Status: Normal mood and affect. Normal behavior. Normal judgment and thought content.     Assessment   39 y.o. 20 at [redacted]w[redacted]d by  03/07/2021, by Last Menstrual Period presenting for routine prenatal visit  Plan   Pregnancy#11 Problems (from 05/31/20 to present)    Problem Noted Resolved   Supervision of high risk pregnancy, antepartum 07/12/2020 by 07/14/2020, MD No   Overview Addendum 01/03/2021  6:04 PM by 03/03/2021, MD      Nursing Staff Provider  Office Location  [facility] Dating  lmp =7 wk Natale Milch  Language  English Anatomy US  complete  Flu Vaccine   Genetic Screen  NIPS: normal xx   TDaP vaccine    Hgb A1C or  GTT Third trimester : 26  Rhogam   not needed   LAB RESULTS   Feeding Plan  Blood Type O POS  Contraception  Antibody Negative (07/20 1416)  Circumcision  Rubella 2.22 (07/20 1416)  Pediatrician   RPR Non Reactive (12/14 1532)   Support Person  HBsAg Negative (07/20 1416)   Prenatal Classes  HIV Non Reactive (12/14 1532)    Varicella  immune  BTL Consent  GBS  (For PCN allergy, check sensitivities)        VBAC Consent   Pap  2021 NIL    Hgb Electro    Covid  CF      SMA          High Risk Pregnancy Diagnoses Polysubstance abuse History of abruption with stillbirth- weekly NSTs at 32 weeks History of cesarean section- desires TOLAC Advanced  maternal age Hashimoto's thyroiditis on synthroid History of anxiety and depression       Previous Version   Substance abuse affecting pregnancy, antepartum 07/12/2020 by Vena Austria, MD No   Overview Addendum 07/25/2020 12:31 PM by Mirna Mires, CNM    07/25/2020 Using MJ during pregnancy.  UDS retrieved at this visit. Tested + for  cocaine in May 2021      Previous Version   History of cesarean delivery, antepartum 07/12/2020 by Vena Austria, MD No   History of IUFD 07/12/2020 by Vena Austria, MD No   Labile blood pressure 07/12/2020 by Vena Austria, MD No   History of placental abruption 07/12/2020 by Vena Austria, MD No   Advanced maternal age in multigravida, unspecified trimester 07/12/2020 by Vena Austria, MD No   Hypothyroidism affecting pregnancy, antepartum 07/14/2018 by Berniece Pap, FNP No        Encouraged follow up with endocrine which she is due for. Reattempt TSH collection today Desires VBAC.  Referral to cardiology for SOB, limited activity, history of valvular heart disease and history cocaine usage.   Gestational age appropriate obstetric precautions including but not limited to vaginal bleeding, contractions, leaking of fluid and fetal movement were reviewed in detail with the patient.    Return in about 2 weeks (around 01/03/2021) for ROB in person with MD.  Natale Milch MD [facility], [facility] 12/20/2020, 4:42 PM

## 2020-12-21 LAB — TSH: TSH: 4.58 u[IU]/mL — ABNORMAL HIGH (ref 0.450–4.500)

## 2020-12-24 NOTE — L&D Delivery Note (Signed)
Vaginal Delivery Note  Spontaneous delivery of live viable female infant from the LOA position through an intact perineum. Delivery of anterior right shoulder with gentle downward guidance followed by delivery of the left posterior shoulder with gentle upward guidance. Body followed spontaneously. Infant placed on maternal chest. Nursery present for initial neonatal evaluation. Cord clamped and cut after three minutes. Cord blood was collected. Placenta delivered spontaneously and intact with a 3 vessel cord. Thorough assessment was completed and no lacerations were noted. Uterus firm and below umbilicus at the end of the delivery with fundal massage and pitocin administration.  Mom and baby recovering in stable condition. Sponge and needle counts were correct at the end of the delivery.  APGARS: 1 minute: 9  5 minutes: 9 Weight: pending Epidural: present  Pamela Mooney, CNM, MSN [facility], [facility] 02/22/21 8:36 PM

## 2020-12-27 ENCOUNTER — Ambulatory Visit: Payer: Medicaid Other | Admitting: Cardiology

## 2020-12-27 ENCOUNTER — Other Ambulatory Visit: Payer: Self-pay

## 2020-12-27 ENCOUNTER — Ambulatory Visit (INDEPENDENT_AMBULATORY_CARE_PROVIDER_SITE_OTHER): Payer: Medicaid Other | Admitting: Obstetrics and Gynecology

## 2020-12-27 VITALS — BP 114/70 | Ht 67.0 in | Wt 190.0 lb

## 2020-12-27 DIAGNOSIS — Z3A30 30 weeks gestation of pregnancy: Secondary | ICD-10-CM

## 2020-12-27 DIAGNOSIS — E039 Hypothyroidism, unspecified: Secondary | ICD-10-CM

## 2020-12-27 DIAGNOSIS — O09529 Supervision of elderly multigravida, unspecified trimester: Secondary | ICD-10-CM

## 2020-12-27 DIAGNOSIS — O0990 Supervision of high risk pregnancy, unspecified, unspecified trimester: Secondary | ICD-10-CM

## 2020-12-27 DIAGNOSIS — O34219 Maternal care for unspecified type scar from previous cesarean delivery: Secondary | ICD-10-CM

## 2020-12-27 DIAGNOSIS — O99283 Endocrine, nutritional and metabolic diseases complicating pregnancy, third trimester: Secondary | ICD-10-CM

## 2020-12-27 NOTE — Progress Notes (Signed)
Routine Prenatal Care Visit  Subjective  Pamela Mooney is a 40 y.o. [id number] at [redacted]w[redacted]d being seen today for ongoing prenatal care.  She is currently monitored for the following issues for this high-risk pregnancy and has MDD (major depressive disorder), recurrent episode, moderate (HCC); Hypothyroidism affecting pregnancy, antepartum; Blood pressure elevated without history of HTN; Marijuana use; Hashimoto's thyroiditis; Anxiety; Body mass index 29.0-29.9, adult; Supervision of high risk pregnancy, antepartum; Substance abuse affecting pregnancy, antepartum; History of cesarean delivery, antepartum; History of IUFD; Labile blood pressure; Amenorrhea; History of placental abruption; and Advanced maternal age in multigravida, unspecified trimester on their problem list.  ----------------------------------------------------------------------------------- Patient reports she is continuing to experience fatigue. Denies other concerns..   Contractions: Irregular. Vag. Bleeding: None.  Movement: Present. Denies leaking of fluid.  ----------------------------------------------------------------------------------- The following portions of the patient's history were reviewed and updated as appropriate: allergies, current medications, past family history, past medical history, past social history, past surgical history and problem list. Problem list updated.   Objective  Blood pressure 114/70, height 5\' 7"  (1.702 m), weight 190 lb (86.2 kg), last menstrual period 05/31/2020. Pregravid weight 192 lb (87.1 kg) Total Weight Gain -2 lb (-0.907 kg) Urinalysis:      Fetal Status:     Movement: Present     General:  Alert, oriented and cooperative. Patient is in no acute distress.  Skin: Skin is warm and dry. No rash noted.   Cardiovascular: Normal heart rate noted  Respiratory: Normal respiratory effort, no problems with respiration noted  Abdomen: Soft, gravid, appropriate for gestational age.  Pain/Pressure: Absent     Pelvic:  Cervical exam deferred        Extremities: Normal range of motion.     ental Status: Normal mood and affect. Normal behavior. Normal judgment and thought content.     Assessment   40 y.o. 20 at [redacted]w[redacted]d by  03/07/2021, by Last Menstrual Period presenting for routine prenatal visit  Plan   Pregnancy#11 Problems (from 05/31/20 to present)    Problem Noted Resolved   Supervision of high risk pregnancy, antepartum   Clinic [facility] Prenatal Labs  Dating  LMP = 7w 07/31/20 Blood type: --/--/O POS Performed at [facility], [address] [place], [place]  [phone]/07 1408)   Genetic Screen   NIPS: negx3, XX Antibody:Negative (07/20 1416)  Anatomic 07-04-2006  12/13/20 - wnl Rubella: 2.22 (07/20 1416) Varicella: immune  GTT   Third trimester: 85 RPR: Non Reactive (12/14 1532)   Rhogam  n/a HBsAg: Negative (07/20 1416)   TDaP vaccine                       Flu Shot: HIV: Non Reactive (12/14 1532)   Baby Food                                GBS:   Contraception  Pap: 07/12/20 - NILM/neg  CBB     CS/VBAC  desires VBAC   Support Person      07/12/2020 by 07/14/2020, MD No   Substance abuse affecting pregnancy, antepartum 07/12/2020 by 07/14/2020, MD No   Overview Addendum 07/25/2020 12:31 PM by 09/24/2020, CNM          Previous Version   History of cesarean delivery, antepartum 07/12/2020 by 07/14/2020, MD No   History of IUFD 07/12/2020 by 07/14/2020, MD No  Labile blood pressure 07/12/2020 by Vena Austria, MD No   History of placental abruption 07/12/2020 by Vena Austria, MD No   Advanced maternal age in multigravida, unspecified trimester 07/12/2020 by Vena Austria, MD No   Hypothyroidism affecting pregnancy, antepartum 07/14/2018 by Berniece Pap, FNP No     -Patient reports she was suppose to see cardiology this morning but visit had to be rescheduled due to clinic opening with delayed  start. Patient states she will call back to reschedule.  -Patient has sent TSH to endocrinologist but has not heard back regarding medication changes. Encouraged patient to f/u with them again by the end of the week.   Preterm labor precautions including but not limited to vaginal bleeding, contractions, leaking of fluid and fetal movement were reviewed in detail with the patient.    Keep previously scheduled visit for next week.  Zipporah Plants, CNM, MSN [facility], [facility] 12/27/2020, 5:04 PM

## 2021-01-03 ENCOUNTER — Encounter: Payer: Self-pay | Admitting: Obstetrics and Gynecology

## 2021-01-03 ENCOUNTER — Ambulatory Visit (INDEPENDENT_AMBULATORY_CARE_PROVIDER_SITE_OTHER): Payer: Medicaid Other | Admitting: Obstetrics and Gynecology

## 2021-01-03 ENCOUNTER — Other Ambulatory Visit: Payer: Self-pay

## 2021-01-03 VITALS — BP 120/72 | Ht 67.0 in | Wt 191.0 lb

## 2021-01-03 DIAGNOSIS — Z3A31 31 weeks gestation of pregnancy: Secondary | ICD-10-CM

## 2021-01-03 DIAGNOSIS — O99320 Drug use complicating pregnancy, unspecified trimester: Secondary | ICD-10-CM

## 2021-01-03 DIAGNOSIS — O0990 Supervision of high risk pregnancy, unspecified, unspecified trimester: Secondary | ICD-10-CM

## 2021-01-03 DIAGNOSIS — O34219 Maternal care for unspecified type scar from previous cesarean delivery: Secondary | ICD-10-CM

## 2021-01-03 DIAGNOSIS — O09529 Supervision of elderly multigravida, unspecified trimester: Secondary | ICD-10-CM

## 2021-01-03 NOTE — Progress Notes (Signed)
ROB 31w  Pt states she has some pain in her thighs and pelvis x 1 week.

## 2021-01-03 NOTE — Progress Notes (Signed)
Routine Prenatal Care Visit  Subjective  Pamela Mooney is a 40 y.o. [id number] at [redacted]w[redacted]d being seen today for ongoing prenatal care.  She is currently monitored for the following issues for this high-risk pregnancy and has MDD (major depressive disorder), recurrent episode, moderate (HCC); Hypothyroidism affecting pregnancy, antepartum; Blood pressure elevated without history of HTN; Marijuana use; Hashimoto's thyroiditis; Anxiety; Supervision of high risk pregnancy, antepartum; Substance abuse affecting pregnancy, antepartum; History of cesarean delivery, antepartum; History of IUFD; Labile blood pressure; History of placental abruption; and Advanced maternal age in multigravida, unspecified trimester on their problem list.  ----------------------------------------------------------------------------------- Patient reports she spoke with endocrinologist and slightly increased dose of synthroid to 125 mcg. She has a cardiology appointment later this week. She reports for 2-3 days she has been having atypical water discharge.  Contractions: Not present. Vag. Bleeding: None.  Movement: Present. Denies leaking of fluid.  ----------------------------------------------------------------------------------- The following portions of the patient's history were reviewed and updated as appropriate: allergies, current medications, past family history, past medical history, past social history, past surgical history and problem list. Problem list updated.   Objective  Blood pressure 120/72, height 5\' 7"  (1.702 m), weight 191 lb (86.6 kg), last menstrual period 05/31/2020. Pregravid weight 192 lb (87.1 kg) Total Weight Gain -1 lb (-0.454 kg) Urinalysis:      Fetal Status: Fetal Heart Rate (bpm): 120 Fundal Height: 31 cm Movement: Present  Presentation: Complete Breech  General:  Alert, oriented and cooperative. Patient is in no acute distress.  Skin: Skin is warm and dry. No rash noted.   Cardiovascular:  Normal heart rate noted  Respiratory: Normal respiratory effort, no problems with respiration noted  Abdomen: Soft, gravid, appropriate for gestational age. Pain/Pressure: Present     Pelvic:  Cervical exam deferred        Extremities: Normal range of motion.  Edema: None  Mental Status: Normal mood and affect. Normal behavior. Normal judgment and thought content.     Assessment   40 y.o. 20 at [redacted]w[redacted]d by  03/07/2021, by Last Menstrual Period presenting for routine prenatal visit  Plan   Pregnancy#11 Problems (from 05/31/20 to present)    Problem Noted Resolved   Supervision of high risk pregnancy, antepartum 07/12/2020 by 07/14/2020, MD No   Overview Addendum 01/03/2021  6:04 PM by 03/03/2021, MD      Nursing Staff Provider  Office Location  [facility] Dating  lmp =7 wk Natale Milch  Language  English Anatomy US  complete  Flu Vaccine   Genetic Screen  NIPS: normal xx   TDaP vaccine    Hgb A1C or  GTT Third trimester : 80  Rhogam   not needed   LAB RESULTS   Feeding Plan  Blood Type O POS  Contraception  Antibody Negative (07/20 1416)  Circumcision  Rubella 2.22 (07/20 1416)  Pediatrician   RPR Non Reactive (12/14 1532)   Support Person  HBsAg Negative (07/20 1416)   Prenatal Classes  HIV Non Reactive (12/14 1532)    Varicella  immune  BTL Consent  GBS  (For PCN allergy, check sensitivities)        VBAC Consent   Pap  2021 NIL    Hgb Electro    Covid  CF      SMA          High Risk Pregnancy Diagnoses Polysubstance abuse History of abruption with stillbirth- weekly NSTs at 32 weeks History of cesarean section- desires TOLAC Advanced maternal  age Hashimoto's thyroiditis on synthroid History of anxiety and depression       Previous Version   Substance abuse affecting pregnancy, antepartum 07/12/2020 by Vena Austria, MD No   Overview Addendum 07/25/2020 12:31 PM by Mirna Mires, CNM    07/25/2020 Using MJ during pregnancy.  UDS retrieved at this  visit. Tested + for cocaine in May 2021      Previous Version   History of cesarean delivery, antepartum 07/12/2020 by Vena Austria, MD No   History of IUFD 07/12/2020 by Vena Austria, MD No   Labile blood pressure 07/12/2020 by Vena Austria, MD No   History of placental abruption 07/12/2020 by Vena Austria, MD No   Advanced maternal age in multigravida, unspecified trimester 07/12/2020 by Vena Austria, MD No   Hypothyroidism affecting pregnancy, antepartum 07/14/2018 by Berniece Pap, FNP No       Offered pelvic exam to evaluate for infection versus pooling of amniotic fluid- she declined Bedside US showed AFI to be 7.5 cm with two pockets more than 2x2 cm. Fundal placenta, complete breech presentation.  Declines going to the hospital for ROM + testing today.  Encouraged her to follow up in the hospital or office is she does have continued leakage of fluid or discharge.    TDAP next visit Growth [place] next visit Start weekly NSTs for history of still birth next visit Formal TOLAC consent next visit  Gestational age appropriate obstetric precautions including but not limited to vaginal bleeding, contractions, leaking of fluid and fetal movement were reviewed in detail with the patient.    Return in about 1 week (around 01/10/2021) for ROB, NST, [place] afi/growth.  Natale Milch MD [facility], [facility] 01/03/2021, 6:04 PM

## 2021-01-03 NOTE — Patient Instructions (Signed)

## 2021-01-04 NOTE — Telephone Encounter (Signed)
Called patient. Reviewed yesterday's visits and finding. Fluid sufficient. Breech position, would attempt version at 36-37 weeks. Patient had declined further evaluation for rupture yesterday. Given her concerns encouraged her to go to L&D for evaluation.

## 2021-01-05 ENCOUNTER — Ambulatory Visit: Payer: Medicaid Other | Admitting: Cardiovascular Disease

## 2021-01-05 NOTE — Progress Notes (Deleted)
Cardiology Office Note   Date:  01/05/2021   ID:  Pamela Mooney, DOB Jan 25, 1981, MRN [id number]  PCP:  Berniece Pap, FNP  Cardiologist:  Lorine Bears, MD   No chief complaint on file.     History of Present Illness: Pamela Mooney is a 40 y.o. female who presents for ***    Past Medical History:  Diagnosis Date  . Anxiety   . Depression   . Hashimoto's disease   . Thyroid disease     Past Surgical History:  Procedure Laterality Date  . CESAREAN SECTION  07/31/2005   Full term Stillborn     Current Outpatient Medications  Medication Sig Dispense Refill  . Blood Pressure Monitoring (BLOOD PRESSURE MONITOR/L CUFF) MISC 1 Device by Does not apply route once as needed for up to 1 dose. Monitor blood pressure brachial ( upper arm) once daily and keep log. 1 each 0  . buPROPion (WELLBUTRIN XL) 300 MG 24 hr tablet Take 300 mg by mouth every morning.    . clonazePAM (KLONOPIN) 1 MG tablet SMARTSIG:1-2.5 Tablet(s) By Mouth Daily PRN    . EUTHYROX 112 MCG tablet Take by mouth.    . fluticasone (FLONASE) 50 MCG/ACT nasal spray Place 1 spray into both nostrils daily. For seasonal allergies 16 g 2  . Misc. Devices (PULSE OXIMETER) MISC 1 Device by Does not apply route daily as needed. Monitor heart rate and oxygen saturation once daily and keep log. Leave on finger for one minute before recording. 1 each 0  . ondansetron (ZOFRAN ODT) 4 MG disintegrating tablet Take 1 tablet (4 mg total) by mouth every 6 (six) hours as needed for nausea. 20 tablet 2  . pantoprazole (PROTONIX) 40 MG tablet Take 1 tablet (40 mg total) by mouth daily. 90 tablet 0  . traZODone (DESYREL) 100 MG tablet Take 100 mg by mouth at bedtime.     No current facility-administered medications for this visit.    Allergies:   Patient has no known allergies.    Social History:  The patient  reports that she has never smoked. She has never used smokeless tobacco. She reports previous alcohol use.  She reports that she does not use drugs.   Family History:  The patient's ***family history is not on file.    ROS:  Please see the history of present illness.   Otherwise, review of systems are positive for {NONE DEFAULTED:18576::"none"}.   All other systems are reviewed and negative.    PHYSICAL EXAM: VS:  LMP 05/31/2020  , BMI There is no height or weight on file to calculate BMI. GEN: Well nourished, well developed, in no acute distress  HEENT: normal  Neck: no JVD, carotid bruits, or masses Cardiac: ***RRR; no murmurs, rubs, or gallops,no edema  Respiratory:  clear to auscultation bilaterally, normal work of breathing GI: soft, nontender, nondistended, + BS MS: no deformity or atrophy  Skin: warm and dry, no rash Neuro:  Strength and sensation are intact Psych: euthymic mood, full affect   EKG:  EKG {ACTION; IS/IS FIE:33295188} ordered today. The ekg ordered today demonstrates ***   Recent Labs: 10/14/2020: ALT 5; BUN 6; Creatinine, Ser 0.53; Potassium 4.0; Sodium 134 12/06/2020: Hemoglobin 10.6; Platelets 266 12/20/2020: TSH 4.580    Lipid Panel    Component Value Date/Time   CHOL 257 (H) 04/27/2020 0929   TRIG 80 04/27/2020 0929   HDL 49 04/27/2020 0929   CHOLHDL 5.2 (H) 04/27/2020 4166  LDLCALC 194 (H) 04/27/2020 0929      Wt Readings from Last 3 Encounters:  01/03/21 191 lb (86.6 kg)  12/27/20 190 lb (86.2 kg)  12/20/20 188 lb 9.6 oz (85.5 kg)      Other studies Reviewed: Additional studies/ records that were reviewed today include: ***. Review of the above records demonstrates: ***  No flowsheet data found.    ASSESSMENT AND PLAN:  1.  ***    Disposition:   FU with *** in {gen number 3-83:291916} {Days to years:10300}  Signed,  Lorine Bears, MD  01/05/2021 3:17 PM    [facility]

## 2021-01-06 ENCOUNTER — Encounter: Payer: Self-pay | Admitting: Cardiovascular Disease

## 2021-01-11 ENCOUNTER — Ambulatory Visit (INDEPENDENT_AMBULATORY_CARE_PROVIDER_SITE_OTHER): Payer: Medicaid Other | Admitting: Obstetrics & Gynecology

## 2021-01-11 ENCOUNTER — Ambulatory Visit: Payer: Medicaid Other

## 2021-01-11 ENCOUNTER — Encounter: Payer: Self-pay | Admitting: Obstetrics & Gynecology

## 2021-01-11 ENCOUNTER — Other Ambulatory Visit: Payer: Self-pay

## 2021-01-11 ENCOUNTER — Observation Stay
Admission: EM | Admit: 2021-01-11 | Discharge: 2021-01-11 | Disposition: A | Discharge status: Home / Self Care | Payer: Medicaid Other | Attending: Obstetrics and Gynecology | Admitting: Obstetrics and Gynecology

## 2021-01-11 ENCOUNTER — Observation Stay: Payer: Medicaid Other

## 2021-01-11 ENCOUNTER — Other Ambulatory Visit: Payer: Self-pay | Admitting: Obstetrics

## 2021-01-11 VITALS — BP 120/80 | Wt 189.0 lb

## 2021-01-11 DIAGNOSIS — O09529 Supervision of elderly multigravida, unspecified trimester: Secondary | ICD-10-CM

## 2021-01-11 DIAGNOSIS — O09293 Supervision of pregnancy with other poor reproductive or obstetric history, third trimester: Secondary | ICD-10-CM

## 2021-01-11 DIAGNOSIS — O99320 Drug use complicating pregnancy, unspecified trimester: Secondary | ICD-10-CM

## 2021-01-11 DIAGNOSIS — O368330 Maternal care for abnormalities of the fetal heart rate or rhythm, third trimester, not applicable or unspecified: Secondary | ICD-10-CM | POA: Diagnosis not present

## 2021-01-11 DIAGNOSIS — Z3A32 32 weeks gestation of pregnancy: Secondary | ICD-10-CM

## 2021-01-11 DIAGNOSIS — O288 Other abnormal findings on antenatal screening of mother: Secondary | ICD-10-CM

## 2021-01-11 DIAGNOSIS — O368131 Decreased fetal movements, third trimester, fetus 1: Secondary | ICD-10-CM

## 2021-01-11 DIAGNOSIS — E039 Hypothyroidism, unspecified: Secondary | ICD-10-CM | POA: Diagnosis not present

## 2021-01-11 DIAGNOSIS — O368130 Decreased fetal movements, third trimester, not applicable or unspecified: Principal | ICD-10-CM

## 2021-01-11 DIAGNOSIS — O99323 Drug use complicating pregnancy, third trimester: Secondary | ICD-10-CM | POA: Diagnosis not present

## 2021-01-11 DIAGNOSIS — O34219 Maternal care for unspecified type scar from previous cesarean delivery: Secondary | ICD-10-CM | POA: Diagnosis not present

## 2021-01-11 DIAGNOSIS — O0990 Supervision of high risk pregnancy, unspecified, unspecified trimester: Secondary | ICD-10-CM

## 2021-01-11 DIAGNOSIS — R0989 Other specified symptoms and signs involving the circulatory and respiratory systems: Secondary | ICD-10-CM

## 2021-01-11 DIAGNOSIS — O09523 Supervision of elderly multigravida, third trimester: Secondary | ICD-10-CM

## 2021-01-11 DIAGNOSIS — O0993 Supervision of high risk pregnancy, unspecified, third trimester: Secondary | ICD-10-CM | POA: Diagnosis not present

## 2021-01-11 DIAGNOSIS — F1290 Cannabis use, unspecified, uncomplicated: Secondary | ICD-10-CM

## 2021-01-11 DIAGNOSIS — O219 Vomiting of pregnancy, unspecified: Secondary | ICD-10-CM

## 2021-01-11 DIAGNOSIS — Z8759 Personal history of other complications of pregnancy, childbirth and the puerperium: Secondary | ICD-10-CM

## 2021-01-11 HISTORY — DX: Hypothyroidism, unspecified: E03.9

## 2021-01-11 LAB — URINE DRUG SCREEN, QUALITATIVE (ARMC ONLY)
Amphetamines, Ur Screen: NOT DETECTED
Barbiturates, Ur Screen: NOT DETECTED
Benzodiazepine, Ur Scrn: POSITIVE — AB
Cannabinoid 50 Ng, Ur ~~LOC~~: POSITIVE — AB
Cocaine Metabolite,Ur ~~LOC~~: NOT DETECTED
MDMA (Ecstasy)Ur Screen: NOT DETECTED
Methadone Scn, Ur: NOT DETECTED
Opiate, Ur Screen: NOT DETECTED
Phencyclidine (PCP) Ur S: NOT DETECTED
Tricyclic, Ur Screen: NOT DETECTED

## 2021-01-11 MED ORDER — ACETAMINOPHEN 325 MG PO TABS: 650.0000 mg | ORAL_TABLET | ORAL | Status: DC | PRN

## 2021-01-11 MED ORDER — LIDOCAINE HCL (PF) 1 % IJ SOLN: 30.0000 mL | INTRAMUSCULAR | Status: DC | PRN

## 2021-01-11 MED ORDER — ONDANSETRON HCL 4 MG/2ML IJ SOLN: 4.0000 mg | Freq: Four times a day (QID) | INTRAMUSCULAR | Status: DC | PRN

## 2021-01-11 MED ORDER — BETAMETHASONE SOD PHOS & ACET 6 (3-3) MG/ML IJ SUSP
12.0000 mg | INTRAMUSCULAR | Status: DC
  Administered 2021-01-11: 12 mg via INTRAMUSCULAR
  Filled 2021-01-11: qty 5

## 2021-01-11 MED ORDER — ONDANSETRON 4 MG PO TBDP: 8.0000 mg | ORAL_TABLET | Freq: Three times a day (TID) | ORAL | 2 refills | Status: DC | PRN

## 2021-01-11 NOTE — Patient Instructions (Signed)
Thank you for choosing [facility]. As part of our ongoing efforts to improve patient experience, we would appreciate your feedback. Please fill out the short survey that you will receive by mail or MyChart. Your opinion is important to [place]! -Dr Mauro Kaufmann Test A nonstress test is a procedure that is done during pregnancy in order to check the baby's heartbeat. The procedure can help to show if the baby (fetus) is healthy. It is commonly done when:  The baby is past his or her due date.  The pregnancy is high risk.  The baby is moving less than normal.  The mother has lost a pregnancy in the past.  The health care provider suspects a problem with the baby's growth.  There is too much or too little amniotic fluid. The procedure is often done in the third trimester of pregnancy to find out if an early delivery is needed and whether such a delivery is safe. During a nonstress test, the baby's heartbeat is monitored when the baby is resting and when the baby is moving. If the baby is healthy, the heart rate will increase when he or she moves or kicks and will return to normal when he or she rests. Tell a health care provider about:  Any allergies you have.  Any medical conditions you have.  All medicines you are taking, including vitamins, herbs, eye drops, creams, and over-the-counter medicines.  Any surgeries you have had.  Any past pregnancies you have had. What are the risks? There are no risks to you or your baby from a nonstress test. This procedure should not be painful or uncomfortable. What happens before the procedure?  Eat a meal right before the test or as directed by your health care provider. Food may help encourage the baby to move.  Use the restroom right before the test. What happens during the procedure?  Two monitors will be placed on your abdomen. One will record the baby's heart rate and the other will record the contractions of your uterus.  You may  be asked to lie down on your side or to sit upright.  You may be given a button to press when you feel your baby move.  Your health care provider will listen to your baby's heartbeat and record it. He or she may also watch your baby's heartbeat on a screen.  If the baby seems to be sleeping, you may be asked to drink some juice or soda, eat a snack, or change positions. The procedure may vary among health care providers and hospitals.   What can I expect after procedure?  Your health care provider will discuss the test results with you and make recommendations for the future. Depending on the results, your health care provider may order additional tests or another course of action.  If your health care provider gave you any diet or activity instructions, make sure to follow them.  Keep all follow-up visits. This is important. Summary  A nonstress test is a procedure that is done during pregnancy in order to check the baby's heartbeat. The procedure can help show if the baby is healthy.  The procedure is often done in the third trimester of pregnancy to find out if an early delivery is needed and whether such a delivery is safe.  During a nonstress test, the baby's heartbeat is monitored when the baby is resting and when the baby is moving. If the baby is healthy, the heart rate will increase when he  or she moves or kicks and will return to normal when he or she rests.  Your health care provider will discuss the test results with you and make recommendations for the future. This information is not intended to replace advice given to you by your health care provider. Make sure you discuss any questions you have with your health care provider. Document Revised: 09/19/2020 Document Reviewed: 09/19/2020 Elsevier Patient Education  2021 [employer].

## 2021-01-11 NOTE — Discharge Summary (Signed)
Discharge education provided to patient. Patient verbalized understanding. Patient discharged home with significant other. Red flag signs and symptoms reviewed.

## 2021-01-11 NOTE — Discharge Instructions (Signed)
Biophysical Profile A biophysical profile is a noninvasive test that checks the health of the developing baby (fetus) and the placenta of the pregnant person. This test may be recommended when a pregnancy is at a higher risk for certain problems. A biophysical profile is usually done during the last 3 months of pregnancy (third trimester). This procedure combines two tests. In one test, a device strapped to your belly will measure your baby's heart rate. The other test uses sound waves and a computer (ultrasound) to create an image of your baby inside your uterus. The ultrasound also measures the amount of fluid (amniotic fluid) inside your uterus. These tests tell your health care provider about the overall health of your baby. Tell a health care provider about:  Any allergies you have.  All medicines you are taking, including vitamins, herbs, eye drops, creams, and over-the-counter medicines.  Any medical conditions you have, such as high blood pressure or diabetes.  Any concerns you have about your pregnancy or any pregnancy-related complications. These may include: ? Abdominal pain or contractions. ? Nausea or vomiting. ? Vaginal bleeding. ? Leaking of amniotic fluid. ? Decreased fetal movements. ? Fever or infection. ? Increased swelling. ? Headaches. ? Problems with your vision.  How often you feel your baby move. What are the risks? There are no risks to you or your baby from a biophysical profile. What happens before the procedure? Ask your health care provider how to prepare for this test. You may be asked to:  Drink fluids so that you have a full bladder for your ultrasound.  Eat before you arrive for the test. This makes your baby more active. What happens during the procedure?  You will lie on your back on an exam table.  A belt with a sensor will be placed around your belly to measure your baby's heart rate.  You may have to wear another belt and sensor to measure  any muscle movements (contractions) in your uterus.  During the ultrasound, a health care provider or technician will put a small amount of gel on your belly and gently roll a handheld device (transducer) over it. This device sends signals to a computer that creates images of your baby.  Five areas of your baby's health and development will be checked during the biophysical profile: ? Heart rate. ? Breathing. ? Movement. ? Active muscle movement (muscle tone). ? The amount of fluid in your uterus (amniotic fluid). The procedure may vary among health care providers and hospitals.      What can I expect after the procedure?  Your health care provider will discuss your results with you. The results of a biophysical profile are scored in a range of 0 to 10. The scoring is as follows: ? Each area that is evaluated is given a score of 0 or 2 points. ? If you get a score of 6 or less, you may need further testing, or your baby may need to be delivered early. ? A score of 8 to 10 with normal amniotic fluid levels is considered normal.  Unless you need additional testing, you can go home right after the procedure and resume your normal activities. Where to find more information  Office on Women's Health: http://hoffman.com/  [employer]: www.acog.org Summary  A biophysical profile is a noninvasive test to check that your developing baby (fetus) and your placenta are healthy.  A biophysical profile combines two tests: a test to measure your baby's  heart rate and an ultrasound test to create an image of your baby in the uterus. The ultrasound also measures the amount of amniotic fluid inside your uterus.  Tell your health care provider about any concerns you have about your pregnancy or any pregnancy-related complications.  If you get a score of 6 or less, you may need further testing, or your baby may need to be delivered early. A score of 8 to 10  with normal amniotic fluid levels is considered normal. This information is not intended to replace advice given to you by your health care provider. Make sure you discuss any questions you have with your health care provider. Document Revised: 04/12/2020 Document Reviewed: 04/12/2020 Elsevier Patient Education  2021 Elsevier Inc.   Fetal Health Monitoring Overview Health care providers use techniques and tests to monitor a developing baby (fetus) before birth. Evaluation of your baby's well-being can help your health care provider identify possible problems for you and your baby. Fetal assessments can:  Guide health care providers in how best to care for your unborn baby.  Check your unborn baby's overall health. All types of monitoring aim to protect your health and that of your baby. The best way to have a healthy pregnancy and a healthy baby is to learn as much as you can about your pregnancy and to work closely with your health care provider. Types of tests used in fetal monitoring Tests that will be done vary. Tests may include:  Fetal movement counts.  Non-stress test.  Biophysical profile.  Modified biophysical profile.  Contraction stress test.  Umbilical artery Doppler velocimetry. Your health care provider will determine which tests are right for you. No single test is perfectly accurate, and you may need to follow up with your health care provider if there are any concerns. Tell a health care provider about:  Any allergies you have.  All medicines you are taking, including vitamins, herbs, eye drops, creams, and over-the-counter medicines.  Any blood disorders you have.  Any surgeries you have had.  Any medical conditions you have such as diabetes or high blood pressure.  How often you feel your baby move.  Any concerns you have about your pregnancy. What are the risks? Discuss your tests with your health care provider. In most cases, there are no risks to you  or your baby during any of these tests. What happens before the test? Your health care provider will tell you how to prepare for the test. Ask your health care provider about eating and drinking, taking medicines, and other questions you may have. What happens during the test? Fetal movement counts After 16 weeks of pregnancy, you may feel your baby move. As your baby gets bigger, these movements are easier to feel. A fetal movement count is when you count the number of kicks, flutters, swishes, rolls, or jabs over a specific period of time. You record the number and report it to your health care provider. This is strongly encouraged in high-risk pregnancies but is generally recommended for all pregnant women to do. Your health care provider may ask you to start counting fetal movements around 28 weeks of pregnancy. Non-stress test The non-stress test evaluates your baby's heartbeat while your baby is at rest and while your baby is moving. It is often done as part of a set of tests called the biophysical profile and may show signs that it is time for your baby to be delivered. The heart rate in a healthy baby will  speed up when the baby moves or kicks. The heart rate will decrease at rest, and the peaks or accelerations of the heart rate will be lower. If the test brings up any questions or concerns, you may need to have more testing. This test may be done if your pregnancy goes past your due date. It is also commonly done in high-risk pregnancies beginning at 32 weeks. Biophysical profile The biophysical profile is a set of tests that are done to find out how well the baby is doing. It includes the non-stress test along with imaging tests that use sound waves (ultrasound) to create images of your baby. In a biophysical profile, the following tests are done and scored:  Fetal heartbeat.  Fetal breathing.  Fetal movements.  Fetal muscle tone.  Amount of amniotic fluid. Each test gets a score.  The scores are then added together for a total score. A low score may mean that you and your baby need additional monitoring or special care. Sometimes your health care provider may recommend that you deliver early. This test is usually done late in your final 3 months (third trimester) of pregnancy.   Modified biophysical profile A modified biophysical profile is a two-part test. You will have:  An ultrasound exam to check how much fluid is surrounding your baby inside of your womb (amniotic fluid).  A non-stress test to check your baby's heart rate. The results will determine whether a full biophysical profile may be needed. This test is usually done late in your final 3 months (third trimester) of pregnancy. Contraction stress test A contraction stress test monitors the heart rate of your baby during contractions. The test checks to see how your baby will tolerate the stress of labor. Health care providers use this test to further evaluate your baby when other tests, such as the biophysical profile, have shown that there may be a problem. This test may be done:  Any time you are contracting.  Between weeks 32 and 34 of your pregnancy.  Later in the pregnancy, if necessary. Umbilical artery Doppler velocimetry Umbilical artery Doppler velocimetry uses sound waves to measure the flow of blood between you and your baby. This test measures:  The amount of blood flow through the cord that attaches your baby to your placenta (umbilical cord).  Speed of the blood flow. You will likely only need this test to check your baby's condition if you have a high-risk pregnancy. What can I expect after the test?  Your health care provider will discuss the results of testing and make a plan that is right for you and your baby.  You may be given more instructions depending on the test you were given.  Keep all follow-up visits, including visits for tests and routine care. This is  important. Summary  Various tests may be offered to you during your pregnancy to evaluate the well-being of your baby.  Talk to your health care provider about any concerns you may have about your pregnancy and what tests might be right for you.  Keep all follow-up visits. This is important. This information is not intended to replace advice given to you by your health care provider. Make sure you discuss any questions you have with your health care provider. Document Revised: 04/26/2020 Document Reviewed: 04/26/2020 Elsevier Patient Education  2021 [employer].

## 2021-01-11 NOTE — Progress Notes (Signed)
Prenatal Visit Note Date: 01/11/2021 Clinic: [facility]  Subjective:  Pamela Mooney is a 40 y.o. [id number] at [redacted]w[redacted]d being seen today for ongoing prenatal care.  She is currently monitored for the following issues for this high-risk pregnancy and has MDD (major depressive disorder), recurrent episode, moderate (HCC); Hypothyroidism affecting pregnancy, antepartum; Blood pressure elevated without history of HTN; Marijuana use; Hashimoto's thyroiditis; Anxiety; Supervision of high risk pregnancy, antepartum; Substance abuse affecting pregnancy, antepartum; History of cesarean delivery, antepartum; History of IUFD; Labile blood pressure; History of placental abruption; and Advanced maternal age in multigravida, unspecified trimester on their problem list.  Patient reports irreg mild ctxs or BHs, and mucus plug.  No VB.  No ROM.Marland Kitchen    . Vag. Bleeding: None.  Movement: Present. Denies leaking of fluid.   The following portions of the patient's history were reviewed and updated as appropriate: allergies, current medications, past family history, past medical history, past social history, past surgical history and problem list. Problem list updated.  Objective:   Vitals:   01/11/21 1518  BP: 120/80  Weight: 189 lb (85.7 kg)    Fetal Status:     Movement: Present     General:  Alert, oriented and cooperative. Patient is in no acute distress.  Skin: Skin is warm and dry. No rash noted.   Cardiovascular: Normal heart rate noted  Respiratory: Normal respiratory effort, no problems with respiration noted  Abdomen: Soft, gravid, appropriate for gestational age. Pain/Pressure: Present     Pelvic:  Cx FT/40/-3        Extremities: Normal range of motion.     Mental Status: Normal mood and affect. Normal behavior. Normal judgment and thought content.   Urinalysis:      A NST procedure was performed with FHR monitoring and a normal baseline established, appropriate time of 20-40 minutes of evaluation, and  min to no accels >2 seen w 15x15 characteristics.  There are decles x3 in 30 min.  Results show a NON-REACTIVE NST.     Assessment and Plan:  Pregnancy: [id number] at [redacted]w[redacted]d  1. Advanced maternal age in multigravida, unspecified trimester  2. History of cesarean delivery, antepartum  3. Substance abuse affecting pregnancy, antepartum - ToxASSURE Select 13 (MW), Urine  4. Supervision of high risk pregnancy in third trimester  5. [redacted] weeks gestation of pregnancy  6. Preterm labor in third trimester without delivery - Fetal fibronectin - To L&D for NST prolonged monitoring and AFI by [place] - Risks of FHR monitoring discussed - BMZ in case PTL or PTD needed  Preterm labor symptoms and general obstetric precautions including but not limited to vaginal bleeding, contractions, leaking of fluid and fetal movement were reviewed in detail with the patient. Please refer to After Visit Summary for other counseling recommendations.   Pregnancy#11 Problems (from 05/31/20 to present)    Problem Noted Resolved   Supervision of high risk pregnancy, antepartum 07/12/2020 by Vena Austria, MD No   Overview Addendum 01/03/2021  6:04 PM by Natale Milch, MD      Nursing Staff Provider  Office Location  [facility] Dating  lmp =7 wk [place]  Language  English Anatomy US  complete  Flu Vaccine   Genetic Screen  NIPS: normal xx   TDaP vaccine    Hgb A1C or  GTT Third trimester : 68  Rhogam   not needed   LAB RESULTS   Feeding Plan  Blood Type O POS  Contraception  Antibody Negative (07/20 1416)  Circumcision  Rubella 2.22 (07/20 1416)  Pediatrician   RPR Non Reactive (12/14 1532)   Support Person  HBsAg Negative (07/20 1416)   Prenatal Classes  HIV Non Reactive (12/14 1532)    Varicella  immune  BTL Consent  GBS  (For PCN allergy, check sensitivities)        VBAC Consent   Pap  2021 NIL    Hgb Electro    Covid  CF      SMA          High Risk Pregnancy Diagnoses Polysubstance  abuse History of abruption with stillbirth- weekly NSTs at 32 weeks History of cesarean section- desires TOLAC Advanced maternal age Hashimoto's thyroiditis on synthroid History of anxiety and depression       Previous Version   Substance abuse affecting pregnancy, antepartum 07/12/2020 by Vena Austria, MD No   Overview Addendum 07/25/2020 12:31 PM by Mirna Mires, CNM    07/25/2020 Using MJ during pregnancy.  UDS retrieved at this visit. Tested + for cocaine in May 2021      Previous Version   History of cesarean delivery, antepartum 07/12/2020 by Vena Austria, MD No   History of IUFD 07/12/2020 by Vena Austria, MD No   Labile blood pressure 07/12/2020 by Vena Austria, MD No   History of placental abruption 07/12/2020 by Vena Austria, MD No   Advanced maternal age in multigravida, unspecified trimester 07/12/2020 by Vena Austria, MD No   Hypothyroidism affecting pregnancy, antepartum 07/14/2018 by Berniece Pap, FNP No       Return in about 1 week (around 01/18/2021) for HROB w NST and [place] for AFI.  Annamarie Major, MD, [facility], [facility] 01/11/2021  4:11 PM

## 2021-01-11 NOTE — Final Progress Note (Signed)
Final Progress Note  Patient ID: Pamela Mooney MRN: [id number] DOB/AGE: 39-08-1981 39 y.o.  Admit date: 01/11/2021 Admitting provider: Nadara Mustard, MD Discharge date: 01/11/2021   Admission Diagnoses: Abdominal pain in pregnancy in third trimester [redacted] weeks gestation Decreased fetal movement  Discharge Diagnoses:  Active Problems:   Preterm labor   Decreased fetal movements in third trimester  Reactive fetal heart tones [redacted] weeks gestation  History of Present Illness: The patient is a 40 y.o. female [id number] at [redacted]w[redacted]d who presents for prolonged fetal monitoring after being seen at [facility] for antenatal testing and an OB visit today. Her NST in the office was not reactive, and the patient, at that time, shared that her baby was moving less. She had also shared that she passed her mucous plug, and was experiencing mild irregular contractions. Her prenatal history is complicated by a number of risk factors, including: AMA, Hashimotos's Disease, ongoing use of marijuana, hx of Cesarean section, Cocaine use, Depression, and a hx of placental abruption.  Past Medical History:  Diagnosis Date  . Anxiety   . Depression   . Hashimoto's disease   . Hypothyroidism   . Thyroid disease     Past Surgical History:  Procedure Laterality Date  . CESAREAN SECTION  07/31/2005   Full term Stillborn    No current facility-administered medications on file prior to encounter.   Current Outpatient Medications on File Prior to Encounter  Medication Sig Dispense Refill  . clonazePAM (KLONOPIN) 1 MG tablet SMARTSIG:1-2.5 Tablet(s) By Mouth Daily PRN    . levothyroxine (SYNTHROID) 125 MCG tablet Take 125 mcg by mouth daily before breakfast.    . ondansetron (ZOFRAN ODT) 4 MG disintegrating tablet Take 1 tablet (4 mg total) by mouth every 6 (six) hours as needed for nausea. 20 tablet 2  . Blood Pressure Monitoring (BLOOD PRESSURE MONITOR/L CUFF) MISC 1 Device by Does not apply route once as  needed for up to 1 dose. Monitor blood pressure brachial ( upper arm) once daily and keep log. 1 each 0  . buPROPion (WELLBUTRIN XL) 300 MG 24 hr tablet Take 300 mg by mouth every morning. (Patient not taking: Reported on 01/11/2021)    . EUTHYROX 112 MCG tablet Take by mouth. (Patient not taking: Reported on 01/11/2021)    . fluticasone (FLONASE) 50 MCG/ACT nasal spray Place 1 spray into both nostrils daily. For seasonal allergies (Patient not taking: Reported on 01/11/2021) 16 g 2  . Misc. Devices (PULSE OXIMETER) MISC 1 Device by Does not apply route daily as needed. Monitor heart rate and oxygen saturation once daily and keep log. Leave on finger for one minute before recording. 1 each 0  . pantoprazole (PROTONIX) 40 MG tablet Take 1 tablet (40 mg total) by mouth daily. 90 tablet 0  . traZODone (DESYREL) 100 MG tablet Take 100 mg by mouth at bedtime. (Patient not taking: Reported on 01/11/2021)      No Known Allergies  Social History   Socioeconomic History  . Marital status: Married    Spouse name: Not on file  . Number of children: 4  . Years of education: Not on file  . Highest education level: Not on file  Occupational History  . Not on file  Tobacco Use  . Smoking status: Never Smoker  . Smokeless tobacco: Never Used  Vaping Use  . Vaping Use: Every day  Substance and Sexual Activity  . Alcohol use: Not Currently  . Drug use: Yes  Types: Marijuana    Comment: last usage 01/09/2021  . Sexual activity: Yes    Birth control/protection: None  Other Topics Concern  . Not on file  Social History Narrative  . Not on file   Social Determinants of Health   Financial Resource Strain: Not on file  Food Insecurity: Not on file  Transportation Needs: Not on file  Physical Activity: Not on file  Stress: Not on file  Social Connections: Not on file  Intimate Partner Violence: Not on file    History reviewed. No pertinent family history.   Review of Systems  Constitutional:  Negative.   HENT: Negative.   Eyes: Negative.   Respiratory: Negative.   Cardiovascular: Negative.   Gastrointestinal: Negative.   Genitourinary: Negative.   Musculoskeletal: Negative.   Skin: Negative.   Neurological: Negative.   Endo/Heme/Allergies: Negative.   Psychiatric/Behavioral: Negative.      Physical Exam: BP 125/77 (BP Location: Right Arm)   Pulse 92   Temp 98 F (36.7 C) (Oral)   Resp 14   LMP 05/31/2020   Physical Exam Constitutional:      Appearance: Normal appearance.  Genitourinary:     Vulva normal.     Genitourinary Comments: Vaginal exam deferred.  HENT:     Head: Normocephalic and atraumatic.     Nose: Nose normal.  Cardiovascular:     Rate and Rhythm: Normal rate and regular rhythm.     Pulses: Normal pulses.     Heart sounds: Normal heart sounds.  Pulmonary:     Effort: Pulmonary effort is normal.  Abdominal:     General: Bowel sounds are normal.     Palpations: Abdomen is soft.  Musculoskeletal:        General: Normal range of motion.     Cervical back: Normal range of motion and neck supple.  Neurological:     General: No focal deficit present.     Mental Status: She is alert and oriented to person, place, and time.  Skin:    General: Skin is warm and dry.  Psychiatric:        Mood and Affect: Mood normal.        Behavior: Behavior normal.     Consults: None  Significant Findings/ Diagnostic Studies: labs:  Lab Orders     Urine Drug Screen, Qualitative ([facility] only)- + for MJ and Benzos (pt takes Klonapin)  Procedures: EFM NST Baseline FHR: 140 baseline beats/min Variability: moderate Accelerations: present Decelerations: rare brief dips in FHTs, non repetative, less than10 beats per minute Tocometry: no regular contractions seen or palpated  Interpretation:  INDICATIONS: decreased fetal movement, patient reassurance and rule out uterine contractions RESULTS:  A NST procedure was performed with FHR monitoring and a normal  baseline established, appropriate time of 20-40 minutes of evaluation, and accels >2 seen w 15x15 characteristics.  Results show a REACTIVE NST.    Hospital Course: The patient was admitted to Labor and Delivery Triage for observation. She was placed on the fetal monitor for extended EFM. A limited OB ultrasound was performed in Radiology, and the BPP score was 8/8. Her cervix , per sono, is closed, and the NST reactive and reassuring. Due to her history, a urine drug screen was performed and showed + Mj and Benzos. An initial dose of Betamethazone was administered. Her vital signs were also checked and found to be WNL. With the reassuring ultrasound report, the reactive NST result, the patient was discharged home with plans for follow  up in 24 hours for a second BMZ injection. She will also follow up at [facility] next week for her weekly OB visit, an NST and a growth ultrasound with BPP.  Discharge Condition: good  Disposition: Discharge disposition: 01-Home or Self Care     Home in care of family  Diet: Regular diet  Discharge Activity: Activity as tolerated  Discharge Instructions    Discharge activity:  No Restrictions   Complete by: As directed    Discharge diet:  No restrictions   Complete by: As directed    No sexual activity restrictions   Complete by: As directed    Notify physician for a general feeling that "something is not right"   Complete by: As directed    Notify physician for increase or change in vaginal discharge   Complete by: As directed    Notify physician for intestinal cramps, with or without diarrhea, sometimes described as "gas pain"   Complete by: As directed    Notify physician for leaking of fluid   Complete by: As directed    Notify physician for low, dull backache, unrelieved by heat or Tylenol   Complete by: As directed    Notify physician for menstrual like cramps   Complete by: As directed    Notify physician for pelvic pressure   Complete by:  As directed    Notify physician for uterine contractions.  These may be painless and feel like the uterus is tightening or the baby is  "balling up"   Complete by: As directed    Notify physician for vaginal bleeding   Complete by: As directed    PRETERM LABOR:  Includes any of the follwing symptoms that occur between 20 - [redacted] weeks gestation.  If these symptoms are not stopped, preterm labor can result in preterm delivery, placing your baby at risk   Complete by: As directed      Allergies as of 01/11/2021   No Known Allergies     Medication List    TAKE these medications   Blood Pressure Monitor/L Cuff Misc 1 Device by Does not apply route once as needed for up to 1 dose. Monitor blood pressure brachial ( upper arm) once daily and keep log.   buPROPion 300 MG 24 hr tablet Commonly known as: WELLBUTRIN XL Take 300 mg by mouth every morning.   clonazePAM 1 MG tablet Commonly known as: KLONOPIN SMARTSIG:1-2.5 Tablet(s) By Mouth Daily PRN   levothyroxine 125 MCG tablet Commonly known as: SYNTHROID Take 125 mcg by mouth daily before breakfast.   Euthyrox 112 MCG tablet Generic drug: levothyroxine Take by mouth.   fluticasone 50 MCG/ACT nasal spray Commonly known as: FLONASE Place 1 spray into both nostrils daily. For seasonal allergies   ondansetron 4 MG disintegrating tablet Commonly known as: Zofran ODT Take 1 tablet (4 mg total) by mouth every 6 (six) hours as needed for nausea.   pantoprazole 40 MG tablet Commonly known as: Protonix Take 1 tablet (40 mg total) by mouth daily.   Pulse Oximeter Misc 1 Device by Does not apply route daily as needed. Monitor heart rate and oxygen saturation once daily and keep log. Leave on finger for one minute before recording.   traZODone 100 MG tablet Commonly known as: DESYREL Take 100 mg by mouth at bedtime.        Total time spent taking care of this patient: 40 minutes  Signed: Mirna Mires, CNM  01/11/2021, 7:39  PM

## 2021-01-11 NOTE — OB Triage Note (Signed)
Patient is 40yo female, [redacted]w[redacted]d G1P5. Patient was sent over from [facility] office for [place] and NST. Patient denies bleeding. Pt states that she believes that she has a small leak of fluid. Patient denies vaginal discharge but states that she lost her mucous plug yesterday. FM+. Pt denies contractions. VSS. Monitors applied and assessing.

## 2021-01-11 NOTE — Discharge Summary (Signed)
Please see Final Progress Note. Mirna Mires, CNM  01/11/2021 7:15 PM

## 2021-01-12 ENCOUNTER — Inpatient Hospital Stay
Admission: RE | Admit: 2021-01-12 | Discharge: 2021-01-12 | Disposition: A | Discharge status: Home / Self Care | Payer: Medicaid Other | Attending: Obstetrics & Gynecology | Admitting: Obstetrics & Gynecology

## 2021-01-12 DIAGNOSIS — O09529 Supervision of elderly multigravida, unspecified trimester: Secondary | ICD-10-CM

## 2021-01-12 DIAGNOSIS — O34219 Maternal care for unspecified type scar from previous cesarean delivery: Secondary | ICD-10-CM

## 2021-01-12 DIAGNOSIS — O0993 Supervision of high risk pregnancy, unspecified, third trimester: Secondary | ICD-10-CM | POA: Insufficient documentation

## 2021-01-12 DIAGNOSIS — Z8759 Personal history of other complications of pregnancy, childbirth and the puerperium: Secondary | ICD-10-CM

## 2021-01-12 DIAGNOSIS — E039 Hypothyroidism, unspecified: Secondary | ICD-10-CM

## 2021-01-12 DIAGNOSIS — O99320 Drug use complicating pregnancy, unspecified trimester: Secondary | ICD-10-CM

## 2021-01-12 DIAGNOSIS — Z3A33 33 weeks gestation of pregnancy: Secondary | ICD-10-CM | POA: Insufficient documentation

## 2021-01-12 DIAGNOSIS — O0990 Supervision of high risk pregnancy, unspecified, unspecified trimester: Secondary | ICD-10-CM

## 2021-01-12 DIAGNOSIS — R0989 Other specified symptoms and signs involving the circulatory and respiratory systems: Secondary | ICD-10-CM

## 2021-01-12 LAB — FETAL FIBRONECTIN: Fetal Fibronectin: NEGATIVE

## 2021-01-12 MED ORDER — BETAMETHASONE SOD PHOS & ACET 6 (3-3) MG/ML IJ SUSP
INTRAMUSCULAR | Status: AC
  Administered 2021-01-12: 12 mg via INTRAMUSCULAR
  Filled 2021-01-12: qty 5

## 2021-01-12 MED ORDER — BETAMETHASONE SOD PHOS & ACET 6 (3-3) MG/ML IJ SUSP: 12.0000 mg | Freq: Once | INTRAMUSCULAR | Status: AC

## 2021-01-12 MED ORDER — ONDANSETRON 8 MG PO TBDP: 8.0000 mg | ORAL_TABLET | Freq: Three times a day (TID) | ORAL | 0 refills | Status: DC | PRN

## 2021-01-12 NOTE — Progress Notes (Signed)
Second scheduled BMZ injection given. Pt verbalized understanding of POC. Pt reports no contractions, LOF, or bleeding. Alert and oriented.

## 2021-01-17 ENCOUNTER — Ambulatory Visit (INDEPENDENT_AMBULATORY_CARE_PROVIDER_SITE_OTHER): Payer: Medicaid Other

## 2021-01-17 ENCOUNTER — Ambulatory Visit (INDEPENDENT_AMBULATORY_CARE_PROVIDER_SITE_OTHER): Payer: Medicaid Other | Admitting: Obstetrics and Gynecology

## 2021-01-17 ENCOUNTER — Other Ambulatory Visit: Payer: Self-pay

## 2021-01-17 ENCOUNTER — Encounter: Payer: Self-pay | Admitting: Obstetrics and Gynecology

## 2021-01-17 VITALS — BP 124/74 | Wt 192.0 lb

## 2021-01-17 DIAGNOSIS — O0990 Supervision of high risk pregnancy, unspecified, unspecified trimester: Secondary | ICD-10-CM

## 2021-01-17 DIAGNOSIS — Z3A31 31 weeks gestation of pregnancy: Secondary | ICD-10-CM | POA: Diagnosis not present

## 2021-01-17 DIAGNOSIS — O09529 Supervision of elderly multigravida, unspecified trimester: Secondary | ICD-10-CM

## 2021-01-17 DIAGNOSIS — O99320 Drug use complicating pregnancy, unspecified trimester: Secondary | ICD-10-CM

## 2021-01-17 DIAGNOSIS — Z3A33 33 weeks gestation of pregnancy: Secondary | ICD-10-CM | POA: Diagnosis not present

## 2021-01-17 DIAGNOSIS — Z8759 Personal history of other complications of pregnancy, childbirth and the puerperium: Secondary | ICD-10-CM

## 2021-01-17 DIAGNOSIS — E039 Hypothyroidism, unspecified: Secondary | ICD-10-CM

## 2021-01-17 DIAGNOSIS — O34219 Maternal care for unspecified type scar from previous cesarean delivery: Secondary | ICD-10-CM

## 2021-01-17 DIAGNOSIS — O99280 Endocrine, nutritional and metabolic diseases complicating pregnancy, unspecified trimester: Secondary | ICD-10-CM

## 2021-01-17 NOTE — Progress Notes (Signed)
Routine Prenatal Care Visit  Subjective  Pamela Mooney is a 40 y.o. [id number] at [redacted]w[redacted]d being seen today for ongoing prenatal care.  She is currently monitored for the following issues for this high-risk pregnancy and has MDD (major depressive disorder), recurrent episode, moderate (HCC); Hypothyroidism affecting pregnancy, antepartum; Blood pressure elevated without history of HTN; Marijuana use; Hashimoto's thyroiditis; Anxiety; Supervision of high risk pregnancy, antepartum; Substance abuse affecting pregnancy, antepartum; History of cesarean delivery, antepartum; History of IUFD; Labile blood pressure; History of placental abruption; Advanced maternal age in multigravida, unspecified trimester; Preterm labor; and Decreased fetal movements in third trimester on their problem list.  ----------------------------------------------------------------------------------- Patient reports no complaints.   Contractions: Not present. Vag. Bleeding: None.  Movement: Present. Leaking Fluid denies.  Growth u/s: 23rd %ile, AFI 8.4 cm, cephalic ----------------------------------------------------------------------------------- The following portions of the patient's history were reviewed and updated as appropriate: allergies, current medications, past family history, past medical history, past social history, past surgical history and problem list. Problem list updated.  Objective  Blood pressure 124/74, weight 192 lb (87.1 kg), last menstrual period 05/31/2020. Pregravid weight 192 lb (87.1 kg) Total Weight Gain 0 lb (0 kg) Urinalysis: Urine Protein    Urine Glucose    Fetal Status: Fetal Heart Rate (bpm): 130   Movement: Present  Presentation: Vertex  General:  Alert, oriented and cooperative. Patient is in no acute distress.  Skin: Skin is warm and dry. No rash noted.   Cardiovascular: Normal heart rate noted  Respiratory: Normal respiratory effort, no problems with respiration noted  Abdomen: Soft,  gravid, appropriate for gestational age. Pain/Pressure: Absent     Pelvic:  Cervical exam deferred        Extremities: Normal range of motion.  Edema: None  Mental Status: Normal mood and affect. Normal behavior. Normal judgment and thought content.   NST: Baseline FHR: 130 beats/min Variability: moderate Accelerations: present Decelerations: a couple of short, shallow variables with return to baseline. Moderate variability throughout.  Multiple accelerations.  Tocometry: not done  Interpretation:  INDICATIONS: history of IUFD RESULTS:  A NST procedure was performed with FHR monitoring and a normal baseline established, appropriate time of 20-40 minutes of evaluation, and accels >2 seen w 15x15 characteristics.  Results show a REACTIVE NST.    Imaging Results US OB Follow Up  Result Date: 01/17/2021 Patient Name: Pamela Mooney DOB: December 02, 1981 MRN: [id number] ULTRASOUND REPORT Location: [facility] Date of Service: 01/17/2021 Indications:growth/afi Findings: Mason Jim intrauterine pregnancy is visualized with FHR at 159 BPM. Biometrics give an (U/S) Gestational age of [redacted]w[redacted]d and an (U/S) EDD of 03/17/21; this is 10 days behind the clinically established Estimated Date of Delivery: 03/07/21. Fetal presentation is Cephalic. Placenta: fundal. Grade: 2 AFI: 8.4 cm Growth percentile is 22.4.  AC percentile is 13.0 EFW: 1,767g (3lbs 14oz) Impression: 1. [redacted]w[redacted]d Viable Singleton Intrauterine pregnancy previously established criteria. 2. Growth is 22.4 %ile.  AFI is 8.4 cm. Darlina Guys, RT The ultrasound images and findings were reviewed by me and I agree with the above report. Thomasene Mohair, MD, [facility], [facility] 01/17/2021 3:21 PM       Assessment   40 y.o. [id number] at [redacted]w[redacted]d by  03/07/2021, by Last Menstrual Period presenting for routine prenatal visit  Plan   Pregnancy#11 Problems (from 05/31/20 to present)    Problem Noted Resolved   Supervision of high risk  pregnancy, antepartum 07/12/2020 by Vena Austria, MD No   Overview Addendum 01/03/2021  6:04  PM by Natale Milch, MD      Nursing Staff Provider  Office Location  [facility] Dating  lmp =7 wk [place]  Language  English Anatomy US  complete  Flu Vaccine   Genetic Screen  NIPS: normal xx   TDaP vaccine    Hgb A1C or  GTT Third trimester : 52  Rhogam   not needed   LAB RESULTS   Feeding Plan  Blood Type O POS  Contraception  Antibody Negative (07/20 1416)  Circumcision  Rubella 2.22 (07/20 1416)  Pediatrician   RPR Non Reactive (12/14 1532)   Support Person  HBsAg Negative (07/20 1416)   Prenatal Classes  HIV Non Reactive (12/14 1532)    Varicella  immune  BTL Consent  GBS  (For PCN allergy, check sensitivities)        VBAC Consent   Pap  2021 NIL    Hgb Electro    Covid  CF      SMA          High Risk Pregnancy Diagnoses Polysubstance abuse History of abruption with stillbirth- weekly NSTs at 32 weeks History of cesarean section- desires TOLAC Advanced maternal age Hashimoto's thyroiditis on synthroid History of anxiety and depression       Previous Version   Substance abuse affecting pregnancy, antepartum 07/12/2020 by Vena Austria, MD No   Overview Addendum 07/25/2020 12:31 PM by Mirna Mires, CNM    07/25/2020 Using MJ during pregnancy.  UDS retrieved at this visit. Tested + for cocaine in May 2021      Previous Version   History of cesarean delivery, antepartum 07/12/2020 by Vena Austria, MD No   History of IUFD 07/12/2020 by Vena Austria, MD No   Labile blood pressure 07/12/2020 by Vena Austria, MD No   History of placental abruption 07/12/2020 by Vena Austria, MD No   Advanced maternal age in multigravida, unspecified trimester 07/12/2020 by Vena Austria, MD No   Hypothyroidism affecting pregnancy, antepartum 07/14/2018 by Berniece Pap, FNP No       Preterm labor symptoms and general obstetric precautions including but  not limited to vaginal bleeding, contractions, leaking of fluid and fetal movement were reviewed in detail with the patient. Please refer to After Visit Summary for other counseling recommendations.   Return in about 1 week (around 01/24/2021) for U/S for AFI and routine prenatal.   Thomasene Mohair, MD, [facility], [facility] 01/17/2021 3:30 PM

## 2021-01-18 LAB — TOXASSURE SELECT 13 (MW), URINE

## 2021-01-25 ENCOUNTER — Other Ambulatory Visit: Payer: Self-pay

## 2021-01-25 ENCOUNTER — Ambulatory Visit (INDEPENDENT_AMBULATORY_CARE_PROVIDER_SITE_OTHER): Payer: Medicaid Other | Admitting: Obstetrics and Gynecology

## 2021-01-25 ENCOUNTER — Ambulatory Visit (INDEPENDENT_AMBULATORY_CARE_PROVIDER_SITE_OTHER): Payer: Medicaid Other

## 2021-01-25 VITALS — BP 122/82 | Wt 187.0 lb

## 2021-01-25 DIAGNOSIS — Z3A34 34 weeks gestation of pregnancy: Secondary | ICD-10-CM

## 2021-01-25 DIAGNOSIS — Z3A33 33 weeks gestation of pregnancy: Secondary | ICD-10-CM

## 2021-01-25 DIAGNOSIS — Z8759 Personal history of other complications of pregnancy, childbirth and the puerperium: Secondary | ICD-10-CM

## 2021-01-25 DIAGNOSIS — O0990 Supervision of high risk pregnancy, unspecified, unspecified trimester: Secondary | ICD-10-CM

## 2021-01-25 DIAGNOSIS — O09529 Supervision of elderly multigravida, unspecified trimester: Secondary | ICD-10-CM

## 2021-01-25 DIAGNOSIS — O34219 Maternal care for unspecified type scar from previous cesarean delivery: Secondary | ICD-10-CM

## 2021-01-25 LAB — POCT URINALYSIS DIPSTICK OB: Glucose, UA: NEGATIVE

## 2021-01-25 NOTE — Progress Notes (Signed)
Routine Prenatal Care Visit  Subjective  Pamela Mooney is a 40 y.o. [id number] at [redacted]w[redacted]d being seen today for ongoing prenatal care.  She is currently monitored for the following issues for this low-risk pregnancy and has MDD (major depressive disorder), recurrent episode, moderate (HCC); Hypothyroidism affecting pregnancy, antepartum; Blood pressure elevated without history of HTN; Marijuana use; Hashimoto's thyroiditis; Anxiety; Supervision of high risk pregnancy, antepartum; Substance abuse affecting pregnancy, antepartum; History of cesarean delivery, antepartum; History of IUFD; Labile blood pressure; History of placental abruption; Advanced maternal age in multigravida, unspecified trimester; Preterm labor; and Decreased fetal movements in third trimester on their problem list.  ----------------------------------------------------------------------------------- Patient reports no complaints.   Contractions: Not present. Vag. Bleeding: None.  Movement: Present. Denies leaking of fluid.  ----------------------------------------------------------------------------------- The following portions of the patient's history were reviewed and updated as appropriate: allergies, current medications, past family history, past medical history, past social history, past surgical history and problem list. Problem list updated.   Objective  Blood pressure 122/82, weight 187 lb (84.8 kg), last menstrual period 05/31/2020. Pregravid weight 192 lb (87.1 kg) Total Weight Gain -5 lb (-2.268 kg)  Body mass index is 29.29 kg/m.  Urinalysis:      Fetal Status: Fetal Heart Rate (bpm): 150 Fundal Height: 32 cm Movement: Present  Presentation: Vertex  General:  Alert, oriented and cooperative. Patient is in no acute distress.  Skin: Skin is warm and dry. No rash noted.   Cardiovascular: Normal heart rate noted  Respiratory: Normal respiratory effort, no problems with respiration noted  Abdomen: Soft, gravid,  appropriate for gestational age. Pain/Pressure: Absent     Pelvic:  Cervical exam deferred        Extremities: Normal range of motion.     ental Status: Normal mood and affect. Normal behavior. Normal judgment and thought content.   US OB Limited  Result Date: 01/25/2021 Patient Name: SAMANI DEAL DOB: 1981/02/17 MRN: [id number] ULTRASOUND REPORT Location: [facility] Date of Service: 01/25/2021 Indications:AFI Findings: Mason Jim intrauterine pregnancy is visualized with FHR at 136 BPM. Fetal presentation is Cephalic. Placenta: fundal. Grade: 2 AFI: 9.0 cm Impression: 1. [redacted]w[redacted]d Viable Singleton Intrauterine pregnancy dated by previously established criteria. 2. AFI is 9.0 cm. Recommendations: 1.Clinical correlation with the patient's History and Physical Exam. Darlina Guys, RT There is a singleton gestation with normal amniotic fluid volume. The visualized fetal anatomy appears within normal limits within the resolution of ultrasound as described above.  It must be noted that a normal ultrasound is unable to rule out fetal aneuploidy.  Vena Austria, MD, [facility], [facility] 01/25/2021, 2:14 PM  US OB Limited  Result Date: 01/11/2021 CLINICAL DATA:  Decreased fetal movement. EXAM: LIMITED OBSTETRIC ULTRASOUND AND BIOPHYSICAL PROFILE FINDINGS: Number of Fetuses: 1 Heart Rate:  141 bpm Movement: Yes Presentation: Cephalic Placental Location: Posterior Previa: No Amniotic Fluid (Subjective):  Decreased AFI: 7.0 cm (less than 2.5th percentile) BPD:  7.92cm 31w 6d MATERNAL FINDINGS: Cervix:  Appears closed. Uterus/Adnexae: No abnormality visualized. Movement:  2  Time: 26 minutes Breathing: 2 Tone:  2 Amniotic Fluid: 2 Total Score:  8 IMPRESSION: Single, viable intrauterine pregnancy at approximately 31 weeks and 6 days gestation by ultrasound evaluation. Decreased AFI measurement of 7.0 cm (less than 2.5th percentile). Biophysical profile score is 8 out of 8. This exam is  performed on an emergent basis and does not comprehensively evaluate fetal size, dating, or anatomy; follow-up complete OB US should be considered if  further fetal assessment is warranted. Electronically Signed   By: Aram Candela M.D.   On: 01/11/2021 18:26   US OB Follow Up  Result Date: 01/17/2021 Patient Name: RAELYN RACETTE DOB: 30-Jan-1981 MRN: [id number] ULTRASOUND REPORT Location: [facility] Date of Service: 01/17/2021 Indications:growth/afi Findings: Mason Jim intrauterine pregnancy is visualized with FHR at 159 BPM. Biometrics give an (U/S) Gestational age of [redacted]w[redacted]d and an (U/S) EDD of 03/17/21; this is 10 days behind the clinically established Estimated Date of Delivery: 03/07/21. Fetal presentation is Cephalic. Placenta: fundal. Grade: 2 AFI: 8.4 cm Growth percentile is 22.4.  AC percentile is 13.0 EFW: 1,767g (3lbs 14oz) Impression: 1. [redacted]w[redacted]d Viable Singleton Intrauterine pregnancy previously established criteria. 2. Growth is 22.4 %ile.  AFI is 8.4 cm. Darlina Guys, RT The ultrasound images and findings were reviewed by me and I agree with the above report. Thomasene Mohair, MD, [facility], [facility] 01/17/2021 3:21 PM     US FETAL BPP WO NON STRESS  Result Date: 01/11/2021 CLINICAL DATA:  Decreased fetal movement. EXAM: BIOPHYSICAL PROFILE FINDINGS: Number of Fetuses: 1 Heart Rate: 141 bpm Presentation: Cephalic Movement: 2 time: 26 minutes Breathing: 2 Tone: 2 Amniotic Fluid: 2 Total Score: 8 IMPRESSION: Total biophysical profile score of 8/8. Electronically Signed   By: Aram Candela M.D.   On: 01/11/2021 18:33     Assessment   39 y.o. [id number] at [redacted]w[redacted]d by  03/07/2021, by Last Menstrual Period presenting for routine prenatal visit  Plan   Pregnancy#11 Problems (from 05/31/20 to present)    Problem Noted Resolved   Supervision of high risk pregnancy, antepartum 07/12/2020 by Vena Austria, MD No   Overview Addendum 01/03/2021  6:04 PM by Natale Milch, MD      Nursing Staff Provider  Office Location  [facility] Dating  lmp =7 wk [place]  Language  English Anatomy US  complete  Flu Vaccine   Genetic Screen  NIPS: normal xx   TDaP vaccine    Hgb A1C or  GTT Third trimester : 79  Rhogam   not needed   LAB RESULTS   Feeding Plan  Blood Type O POS  Contraception  Antibody Negative (07/20 1416)  Circumcision  Rubella 2.22 (07/20 1416)  Pediatrician   RPR Non Reactive (12/14 1532)   Support Person  HBsAg Negative (07/20 1416)   Prenatal Classes  HIV Non Reactive (12/14 1532)    Varicella  immune  BTL Consent  GBS  (For PCN allergy, check sensitivities)        VBAC Consent   Pap  2021 NIL    Hgb Electro    Covid  CF      SMA          High Risk Pregnancy Diagnoses Polysubstance abuse History of abruption with stillbirth- weekly NSTs at 32 weeks History of cesarean section- desires TOLAC Advanced maternal age Hashimoto's thyroiditis on synthroid History of anxiety and depression       Previous Version   Substance abuse affecting pregnancy, antepartum 07/12/2020 by Vena Austria, MD No   Overview Addendum 07/25/2020 12:31 PM by Mirna Mires, CNM    07/25/2020 Using MJ during pregnancy.  UDS retrieved at this visit. Tested + for cocaine in May 2021      Previous Version   History of cesarean delivery, antepartum 07/12/2020 by Vena Austria, MD No   History of IUFD 07/12/2020 by Vena Austria, MD No   Labile blood pressure 07/12/2020 by  Vena Austria, MD No   History of placental abruption 07/12/2020 by Vena Austria, MD No   Advanced maternal age in multigravida, unspecified trimester 07/12/2020 by Vena Austria, MD No   Hypothyroidism affecting pregnancy, antepartum 07/14/2018 by Berniece Pap, FNP No       Gestational age appropriate obstetric precautions including but not limited to vaginal bleeding, contractions, leaking of fluid and fetal movement were reviewed in detail with the  patient.    Return in about 1 week (around 02/01/2021) for ROB.  Vena Austria, MD, [facility] Core [facility], [facility] 01/25/2021, 2:23 PM

## 2021-01-26 ENCOUNTER — Other Ambulatory Visit: Payer: Self-pay | Admitting: Obstetrics

## 2021-01-26 DIAGNOSIS — O219 Vomiting of pregnancy, unspecified: Secondary | ICD-10-CM

## 2021-01-26 MED ORDER — ONDANSETRON 8 MG PO TBDP: 8.0000 mg | ORAL_TABLET | Freq: Three times a day (TID) | ORAL | 0 refills | Status: DC | PRN

## 2021-02-01 ENCOUNTER — Other Ambulatory Visit: Payer: Self-pay

## 2021-02-01 ENCOUNTER — Encounter: Payer: Self-pay | Admitting: Obstetrics

## 2021-02-01 ENCOUNTER — Ambulatory Visit (INDEPENDENT_AMBULATORY_CARE_PROVIDER_SITE_OTHER): Payer: Medicaid Other | Admitting: Obstetrics

## 2021-02-01 VITALS — BP 120/80 | Wt 185.0 lb

## 2021-02-01 DIAGNOSIS — Z3A35 35 weeks gestation of pregnancy: Secondary | ICD-10-CM

## 2021-02-01 DIAGNOSIS — O34219 Maternal care for unspecified type scar from previous cesarean delivery: Secondary | ICD-10-CM

## 2021-02-01 DIAGNOSIS — Z8759 Personal history of other complications of pregnancy, childbirth and the puerperium: Secondary | ICD-10-CM

## 2021-02-01 DIAGNOSIS — E039 Hypothyroidism, unspecified: Secondary | ICD-10-CM

## 2021-02-01 DIAGNOSIS — O99280 Endocrine, nutritional and metabolic diseases complicating pregnancy, unspecified trimester: Secondary | ICD-10-CM

## 2021-02-01 DIAGNOSIS — R0989 Other specified symptoms and signs involving the circulatory and respiratory systems: Secondary | ICD-10-CM

## 2021-02-01 DIAGNOSIS — O99320 Drug use complicating pregnancy, unspecified trimester: Secondary | ICD-10-CM

## 2021-02-01 DIAGNOSIS — O0990 Supervision of high risk pregnancy, unspecified, unspecified trimester: Secondary | ICD-10-CM

## 2021-02-01 DIAGNOSIS — O09529 Supervision of elderly multigravida, unspecified trimester: Secondary | ICD-10-CM

## 2021-02-01 LAB — POCT URINALYSIS DIPSTICK OB
Glucose, UA: NEGATIVE
POC,PROTEIN,UA: NEGATIVE

## 2021-02-01 NOTE — Progress Notes (Signed)
Routine Prenatal Care Visit  Subjective  Pamela Mooney is a 40 y.o. [id number] at [redacted]w[redacted]d being seen today for ongoing prenatal care.  She is currently monitored for the following issues for this high-risk pregnancy and has MDD (major depressive disorder), recurrent episode, moderate (HCC); Hypothyroidism affecting pregnancy, antepartum; Blood pressure elevated without history of HTN; Marijuana use; Hashimoto's thyroiditis; Anxiety; Supervision of high risk pregnancy, antepartum; Substance abuse affecting pregnancy, antepartum; History of cesarean delivery, antepartum; History of IUFD; Labile blood pressure; History of placental abruption; Advanced maternal age in multigravida, unspecified trimester; Preterm labor; and Decreased fetal movements in third trimester on their problem list.  ----------------------------------------------------------------------------------- Patient reports no bleeding, no cramping, no leaking and but she does have peovic pressure at times. Ha snot purchased a maternity belt as suggested. Asking about IOL.   Contractions: Irregular. Vag. Bleeding: None.  Movement: Present. Leaking Fluid denies.  ----------------------------------------------------------------------------------- The following portions of the patient's history were reviewed and updated as appropriate: allergies, current medications, past family history, past medical history, past social history, past surgical history and problem list. Problem list updated.  Objective  Blood pressure 120/80, weight 185 lb (83.9 kg), last menstrual period 05/31/2020. Pregravid weight 192 lb (87.1 kg) Total Weight Gain -7 lb (-3.175 kg) Urinalysis: Urine Protein    Urine Glucose    Fetal Status:     Movement: Present     General:  Alert, oriented and cooperative. Patient is in no acute distress.  Skin: Skin is warm and dry. No rash noted.   Cardiovascular: Normal heart rate noted  Respiratory: Normal respiratory effort, no  problems with respiration noted  Abdomen: Soft, gravid, appropriate for gestational age. Pain/Pressure: Absent     Pelvic:  Cervical exam deferred        Extremities: Normal range of motion.     Mental Status: Normal mood and affect. Normal behavior. Normal judgment and thought content.   Assessment   40 y.o. [id number] at [redacted]w[redacted]d by  03/07/2021, by Last Menstrual Period presenting for routine prenatal visit  Plan   Pregnancy#11 Problems (from 05/31/20 to present)    Problem Noted Resolved   Supervision of high risk pregnancy, antepartum 07/12/2020 by Vena Austria, MD No   Overview Addendum 01/03/2021  6:04 PM by Natale Milch, MD      Nursing Staff Provider  Office Location  [facility] Dating  lmp =7 wk [place]  Language  English Anatomy US  complete  Flu Vaccine   Genetic Screen  NIPS: normal xx   TDaP vaccine    Hgb A1C or  GTT Third trimester : 3  Rhogam   not needed   LAB RESULTS   Feeding Plan  Blood Type O POS  Contraception  Antibody Negative (07/20 1416)  Circumcision  Rubella 2.22 (07/20 1416)  Pediatrician   RPR Non Reactive (12/14 1532)   Support Person  HBsAg Negative (07/20 1416)   Prenatal Classes  HIV Non Reactive (12/14 1532)    Varicella  immune  BTL Consent  GBS  (For PCN allergy, check sensitivities)        VBAC Consent   Pap  2021 NIL    Hgb Electro    Covid  CF      SMA          High Risk Pregnancy Diagnoses Polysubstance abuse History of abruption with stillbirth- weekly NSTs at 32 weeks History of cesarean section- desires TOLAC Advanced maternal age Hashimoto's thyroiditis on synthroid History of anxiety and depression  Previous Version   Substance abuse affecting pregnancy, antepartum 07/12/2020 by Vena Austria, MD No   Overview Addendum 07/25/2020 12:31 PM by Mirna Mires, CNM    07/25/2020 Using MJ during pregnancy.  UDS retrieved at this visit. Tested + for cocaine in May 2021      Previous Version   History of  cesarean delivery, antepartum 07/12/2020 by Vena Austria, MD No   History of IUFD 07/12/2020 by Vena Austria, MD No   Labile blood pressure 07/12/2020 by Vena Austria, MD No   History of placental abruption 07/12/2020 by Vena Austria, MD No   Advanced maternal age in multigravida, unspecified trimester 07/12/2020 by Vena Austria, MD No   Hypothyroidism affecting pregnancy, antepartum 07/14/2018 by Berniece Pap, FNP No       Preterm labor symptoms and general obstetric precautions including but not limited to vaginal bleeding, contractions, leaking of fluid and fetal movement were reviewed in detail with the patient. Please refer to After Visit Summary for other counseling recommendations.  She ask for another sono. Reminded that she just had a sono last week. Also really desires IOL . Explained that at 39 weeks this would be an option. To discuss at next appt.  Return in about 1 week (around 02/08/2021) for return OB, GBS.  Mirna Mires, CNM  02/01/2021 3:29 PM

## 2021-02-01 NOTE — Addendum Note (Signed)
Addended by: Cornelius Moras D on: 02/01/2021 03:52 PM   Modules accepted: Orders

## 2021-02-07 ENCOUNTER — Encounter: Payer: Self-pay | Admitting: Obstetrics and Gynecology

## 2021-02-07 ENCOUNTER — Other Ambulatory Visit: Payer: Self-pay

## 2021-02-07 ENCOUNTER — Ambulatory Visit (INDEPENDENT_AMBULATORY_CARE_PROVIDER_SITE_OTHER): Payer: Medicaid Other | Admitting: Obstetrics and Gynecology

## 2021-02-07 VITALS — BP 122/80 | Wt 189.0 lb

## 2021-02-07 DIAGNOSIS — Z8759 Personal history of other complications of pregnancy, childbirth and the puerperium: Secondary | ICD-10-CM

## 2021-02-07 DIAGNOSIS — O99280 Endocrine, nutritional and metabolic diseases complicating pregnancy, unspecified trimester: Secondary | ICD-10-CM

## 2021-02-07 DIAGNOSIS — O34219 Maternal care for unspecified type scar from previous cesarean delivery: Secondary | ICD-10-CM

## 2021-02-07 DIAGNOSIS — E039 Hypothyroidism, unspecified: Secondary | ICD-10-CM

## 2021-02-07 DIAGNOSIS — O99320 Drug use complicating pregnancy, unspecified trimester: Secondary | ICD-10-CM

## 2021-02-07 DIAGNOSIS — Z3685 Encounter for antenatal screening for Streptococcus B: Secondary | ICD-10-CM

## 2021-02-07 DIAGNOSIS — Z3A36 36 weeks gestation of pregnancy: Secondary | ICD-10-CM

## 2021-02-07 DIAGNOSIS — O99210 Obesity complicating pregnancy, unspecified trimester: Secondary | ICD-10-CM

## 2021-02-07 DIAGNOSIS — O0990 Supervision of high risk pregnancy, unspecified, unspecified trimester: Secondary | ICD-10-CM

## 2021-02-07 DIAGNOSIS — O09529 Supervision of elderly multigravida, unspecified trimester: Secondary | ICD-10-CM

## 2021-02-07 LAB — POCT URINALYSIS DIPSTICK OB
Glucose, UA: NEGATIVE
POC,PROTEIN,UA: NEGATIVE

## 2021-02-07 NOTE — Progress Notes (Signed)
Routine Prenatal Care Visit  Subjective  Pamela Mooney is a 40 y.o. [id number] at [redacted]w[redacted]d being seen today for ongoing prenatal care.  She is currently monitored for the following issues for this high-risk pregnancy and has MDD (major depressive disorder), recurrent episode, moderate (HCC); Hypothyroidism affecting pregnancy, antepartum; Blood pressure elevated without history of HTN; Marijuana use; Hashimoto's thyroiditis; Anxiety; Supervision of high risk pregnancy, antepartum; Substance abuse affecting pregnancy, antepartum; History of cesarean delivery, antepartum; History of IUFD; Labile blood pressure; History of placental abruption; Advanced maternal age in multigravida, unspecified trimester; and Preterm labor on their problem list.  ----------------------------------------------------------------------------------- Patient reports no complaints.   Contractions: Irregular. Vag. Bleeding: None.  Movement: Present. Denies leaking of fluid.  ----------------------------------------------------------------------------------- The following portions of the patient's history were reviewed and updated as appropriate: allergies, current medications, past family history, past medical history, past social history, past surgical history and problem list. Problem list updated.   Objective  Blood pressure 122/80, weight 189 lb (85.7 kg), last menstrual period 05/31/2020. Pregravid weight 192 lb (87.1 kg) Total Weight Gain -3 lb (-1.361 kg)  Body mass index is 29.6 kg/m.  Urinalysis:      Fetal Status: Fetal Heart Rate (bpm): 135 Fundal Height: 35 cm Movement: Present  Presentation: Vertex  General:  Alert, oriented and cooperative. Patient is in no acute distress.  Skin: Skin is warm and dry. No rash noted.   Cardiovascular: Normal heart rate noted  Respiratory: Normal respiratory effort, no problems with respiration noted  Abdomen: Soft, gravid, appropriate for gestational age. Pain/Pressure:  Absent     Pelvic:  Cervical exam performed Dilation: 1 Effacement (%): 50 Station: -3  Extremities: Normal range of motion.     ental Status: Normal mood and affect. Normal behavior. Normal judgment and thought content.     Assessment   40 y.o. [id number] at [redacted]w[redacted]d by  03/07/2021, by Last Menstrual Period presenting for routine prenatal visit  Plan   Pregnancy#11 Problems (from 05/31/20 to present)    Problem Noted Resolved   Supervision of high risk pregnancy, antepartum 07/12/2020 by Vena Austria, MD No   Overview Addendum 01/03/2021  6:04 PM by Natale Milch, MD      Nursing Staff Provider  Office Location  [facility] Dating  lmp =7 wk [place]  Language  English Anatomy US  complete  Flu Vaccine   Genetic Screen  NIPS: normal xx   TDaP vaccine    Hgb A1C or  GTT Third trimester : 38  Rhogam   not needed   LAB RESULTS   Feeding Plan  Blood Type O POS  Contraception  Antibody Negative (07/20 1416)  Circumcision  Rubella 2.22 (07/20 1416)  Pediatrician   RPR Non Reactive (12/14 1532)   Support Person  HBsAg Negative (07/20 1416)   Prenatal Classes  HIV Non Reactive (12/14 1532)    Varicella  immune  BTL Consent  GBS  (For PCN allergy, check sensitivities)        VBAC Consent   Pap  2021 NIL    Hgb Electro    Covid  CF      SMA          High Risk Pregnancy Diagnoses Polysubstance abuse History of abruption with stillbirth- weekly NSTs at 32 weeks History of cesarean section- desires TOLAC Advanced maternal age Hashimoto's thyroiditis on synthroid History of anxiety and depression       Previous Version   Substance abuse affecting pregnancy, antepartum 07/12/2020  by Vena Austria, MD No   Overview Addendum 07/25/2020 12:31 PM by Mirna Mires, CNM    07/25/2020 Using MJ during pregnancy.  UDS retrieved at this visit. Tested + for cocaine in May 2021      Previous Version   History of cesarean delivery, antepartum 07/12/2020 by Vena Austria, MD No    History of IUFD 07/12/2020 by Vena Austria, MD No   Labile blood pressure 07/12/2020 by Vena Austria, MD No   History of placental abruption 07/12/2020 by Vena Austria, MD No   Advanced maternal age in multigravida, unspecified trimester 07/12/2020 by Vena Austria, MD No   Hypothyroidism affecting pregnancy, antepartum 07/14/2018 by Berniece Pap, FNP No       Gestational age appropriate obstetric precautions including but not limited to vaginal bleeding, contractions, leaking of fluid and fetal movement were reviewed in detail with the patient.    1) Hypothyroidism - will recheck thyroid panel  2) History of substance abuse - no prior HepC obtained this pregnancy ordered  3) GBS screening - obtained today  4) History of IUFD - declines NST today, will start NST next week.  Discussed limited data on outcome differences and overall low recurrence risk for most patients.  Will also obtain growth to aid patient in decision on TOLAC vs repeat Cesaran    Return in about 1 week (around 02/14/2021) for ROB, NST, growth scan.  Vena Austria, MD, [facility], [facility] 02/07/2021, 8:55 AM

## 2021-02-08 ENCOUNTER — Other Ambulatory Visit: Payer: Self-pay | Admitting: Obstetrics and Gynecology

## 2021-02-08 LAB — HEPATITIS C ANTIBODY: Hep C Virus Ab: <0.1 s/co ratio (ref 0.0–0.9)

## 2021-02-08 LAB — THYROID PANEL WITH TSH
Free Thyroxine Index: 0.6 — ABNORMAL LOW (ref 1.2–4.9)
T3 Uptake Ratio: 9 % — ABNORMAL LOW (ref 24–39)
T4, Total: 6.7 ug/dL (ref 4.5–12.0)
TSH: 21.6 u[IU]/mL — ABNORMAL HIGH (ref 0.450–4.500)

## 2021-02-08 MED ORDER — LEVOTHYROXINE SODIUM 175 MCG PO TABS: 175.0000 ug | ORAL_TABLET | Freq: Every day | ORAL | 5 refills | Status: AC

## 2021-02-08 NOTE — Progress Notes (Signed)
Hypothyroidism based on labs suboptimally corrected at levothyroxine dose, is taking once daily as directed.  Will increase to daily recheck postpartum

## 2021-02-09 LAB — STREP GP B NAA: Strep Gp B NAA: POSITIVE — AB

## 2021-02-15 ENCOUNTER — Telehealth: Payer: Self-pay | Admitting: Obstetrics and Gynecology

## 2021-02-15 ENCOUNTER — Other Ambulatory Visit: Payer: Self-pay

## 2021-02-15 ENCOUNTER — Ambulatory Visit (INDEPENDENT_AMBULATORY_CARE_PROVIDER_SITE_OTHER): Payer: Medicaid Other | Admitting: Obstetrics and Gynecology

## 2021-02-15 ENCOUNTER — Ambulatory Visit (INDEPENDENT_AMBULATORY_CARE_PROVIDER_SITE_OTHER): Payer: Medicaid Other

## 2021-02-15 VITALS — BP 120/74 | Wt 186.5 lb

## 2021-02-15 DIAGNOSIS — Z8759 Personal history of other complications of pregnancy, childbirth and the puerperium: Secondary | ICD-10-CM

## 2021-02-15 DIAGNOSIS — O34219 Maternal care for unspecified type scar from previous cesarean delivery: Secondary | ICD-10-CM | POA: Diagnosis not present

## 2021-02-15 DIAGNOSIS — O09529 Supervision of elderly multigravida, unspecified trimester: Secondary | ICD-10-CM | POA: Diagnosis not present

## 2021-02-15 DIAGNOSIS — O0990 Supervision of high risk pregnancy, unspecified, unspecified trimester: Secondary | ICD-10-CM

## 2021-02-15 DIAGNOSIS — O99320 Drug use complicating pregnancy, unspecified trimester: Secondary | ICD-10-CM

## 2021-02-15 DIAGNOSIS — O99210 Obesity complicating pregnancy, unspecified trimester: Secondary | ICD-10-CM | POA: Diagnosis not present

## 2021-02-15 DIAGNOSIS — Z3A34 34 weeks gestation of pregnancy: Secondary | ICD-10-CM

## 2021-02-15 LAB — POCT URINALYSIS DIPSTICK OB
Glucose, UA: NEGATIVE
POC,PROTEIN,UA: NEGATIVE

## 2021-02-15 NOTE — Progress Notes (Signed)
Routine Prenatal Care Visit  Subjective  Pamela Mooney is a 40 y.o. [id number] at [redacted]w[redacted]d being seen today for ongoing prenatal care.  She is currently monitored for the following issues for this high-risk pregnancy and has MDD (major depressive disorder), recurrent episode, moderate (HCC); Hypothyroidism affecting pregnancy, antepartum; Blood pressure elevated without history of HTN; Marijuana use; Hashimoto's thyroiditis; Anxiety; Supervision of high risk pregnancy, antepartum; Substance abuse affecting pregnancy, antepartum; History of cesarean delivery, antepartum; History of IUFD; Labile blood pressure; History of placental abruption; Advanced maternal age in multigravida, unspecified trimester; and Preterm labor on their problem list.  ----------------------------------------------------------------------------------- Patient reports no complaints.   Contractions: Irritability. Vag. Bleeding: None.  Movement: Present. Denies leaking of fluid.  ----------------------------------------------------------------------------------- The following portions of the patient's history were reviewed and updated as appropriate: allergies, current medications, past family history, past medical history, past social history, past surgical history and problem list. Problem list updated.   Objective  Blood pressure 120/74, weight 186 lb 7.5 oz (84.6 kg), last menstrual period 05/31/2020, unknown if currently breastfeeding. Pregravid weight 192 lb (87.1 kg) Total Weight Gain -5 lb 8.5 oz (-2.508 kg) Urinalysis:      Fetal Status: Fetal Heart Rate (bpm): 130   Movement: Present     General:  Alert, oriented and cooperative. Patient is in no acute distress.  Skin: Skin is warm and dry. No rash noted.   Cardiovascular: Normal heart rate noted  Respiratory: Normal respiratory effort, no problems with respiration noted  Abdomen: Soft, gravid, appropriate for gestational age. Pain/Pressure: Present      Pelvic:  Cervical exam deferred        Extremities: Normal range of motion.  Edema: None  Mental Status: Normal mood and affect. Normal behavior. Normal judgment and thought content.     Assessment   39 y.o. [id number] at [redacted]w[redacted]d by  03/07/2021, by Last Menstrual Period presenting for routine prenatal visit  Plan   Pregnancy#11 Problems (from 05/31/20 to present)    Problem Noted Resolved   Supervision of high risk pregnancy, antepartum 07/12/2020 by Vena Austria, MD No   Overview Addendum 02/15/2021  2:55 PM by Natale Milch, MD      Nursing Staff Provider  Office Location  [facility] Dating  lmp =7 wk [place]  Language  English Anatomy US  complete  Flu Vaccine  declined Genetic Screen  NIPS: normal xx   TDaP vaccine   03/2020 Hgb A1C or  GTT Third trimester : 51  Rhogam   not needed   LAB RESULTS   Feeding Plan bottle Blood Type O POS  Contraception condoms Antibody Negative (07/20 1416)  Circumcision  Rubella 2.22 (07/20 1416)  Pediatrician   RPR Non Reactive (12/14 1532)   Support Person Husband, david HBsAg Negative (07/20 1416)   Prenatal Classes  HIV Non Reactive (12/14 1532)    Varicella  immune  BTL Consent  GBS  (For PCN allergy, check sensitivities)        VBAC Consent  Discussed- desires TOLAC Pap  2021 NIL    Hgb Electro    Covid  vaccinated CF      SMA          High Risk Pregnancy Diagnoses Polysubstance abuse History of abruption with stillbirth- weekly NSTs at 32 weeks History of cesarean section- desires TOLAC Advanced maternal age Hashimoto's thyroiditis on synthroid History of anxiety and depression IUGR       Previous Version   Substance abuse affecting pregnancy,  antepartum 07/12/2020 by Vena Austria, MD No   Overview Addendum 07/25/2020 12:31 PM by Mirna Mires, CNM    07/25/2020 Using MJ during pregnancy.  UDS retrieved at this visit. Tested + for cocaine in May 2021      Previous Version   History of cesarean delivery,  antepartum 07/12/2020 by Vena Austria, MD No   History of IUFD 07/12/2020 by Vena Austria, MD No   Labile blood pressure 07/12/2020 by Vena Austria, MD No   History of placental abruption 07/12/2020 by Vena Austria, MD No   Advanced maternal age in multigravida, unspecified trimester 07/12/2020 by Vena Austria, MD No   Hypothyroidism affecting pregnancy, antepartum 07/14/2018 by Berniece Pap, FNP No     NST: 125 bpm baseline, moderate variability, 15x15 accelerations, no decelerations.  IOL scheduled for 02/21/2021 secondary to IUGR Discussed kick counts and labor precautions.   Gestational age appropriate obstetric precautions including but not limited to vaginal bleeding, contractions, leaking of fluid and fetal movement were reviewed in detail with the patient.    Return if symptoms worsen or fail to improve.  Natale Milch MD [facility], [facility] 02/15/2021, 2:58 PM

## 2021-02-15 NOTE — Patient Instructions (Addendum)
Covid Testing on 02/20/2021 between 8-10:30 am, Drive up testing in front of the [employer]. Follow signs for Pre-admission testing. Please wear a mask.  Induction on 02/21/2021 at 8 am . Enter through the [facility] for an 8 AM induction. Please enter through the ER for a midnight or 5 AM induction.  Please eat a meal prior to your arrival.      Third Trimester of Pregnancy  The third trimester of pregnancy is from week 28 through week 40. This is months 7 through 9. The third trimester is a time when the unborn baby (fetus) is growing rapidly. At the end of the ninth month, the fetus is about 20 inches long and weighs 6-10 pounds. Body changes during your third trimester During the third trimester, your body will continue to go through many changes. The changes vary and generally return to normal after your baby is born. Physical changes  Your weight will continue to increase. You can expect to gain 25-35 pounds (11-16 kg) by the end of the pregnancy if you begin pregnancy at a normal weight. If you are underweight, you can expect to gain 28-40 lb (about 13-18 kg), and if you are overweight, you can expect to gain 15-25 lb (about 7-11 kg).  You may begin to get stretch marks on your hips, abdomen, and breasts.  Your breasts will continue to grow and may hurt. A yellow fluid (colostrum) may leak from your breasts. This is the first milk you are producing for your baby.  You may have changes in your hair. These can include thickening of your hair, rapid growth, and changes in texture. Some people also have hair loss during or after pregnancy, or hair that feels dry or thin.  Your belly button may stick out.  You may notice more swelling in your hands, face, or ankles. Health changes  You may have heartburn.  You may have constipation.  You may develop hemorrhoids.  You may develop swollen, bulging veins (varicose veins) in your legs.  You may have increased body aches in  the pelvis, back, or thighs. This is due to weight gain and increased hormones that are relaxing your joints.  You may have increased tingling or numbness in your hands, arms, and legs. The skin on your abdomen may also feel numb.  You may feel short of breath because of your expanding uterus. Other changes  You may urinate more often because the fetus is moving lower into your pelvis and pressing on your bladder.  You may have more problems sleeping. This may be caused by the size of your abdomen, an increased need to urinate, and an increase in your body's metabolism.  You may notice the fetus "dropping," or moving lower in your abdomen (lightening).  You may have increased vaginal discharge.  You may notice that you have pain around your pelvic bone as your uterus distends. Follow these instructions at home: Medicines  Follow your health care provider's instructions regarding medicine use. Specific medicines may be either safe or unsafe to take during pregnancy. Do not take any medicines unless approved by your health care provider.  Take a prenatal vitamin that contains at least 600 micrograms (mcg) of folic acid. Eating and drinking  Eat a healthy diet that includes fresh fruits and vegetables, whole grains, good sources of protein such as meat, eggs, or tofu, and low-fat dairy products.  Avoid raw meat and unpasteurized juice, milk, and cheese. These carry germs that can harm  you and your baby.  Eat 4 or 5 small meals rather than 3 large meals a day.  You may need to take these actions to prevent or treat constipation: ? Drink enough fluid to keep your urine pale yellow. ? Eat foods that are high in fiber, such as beans, whole grains, and fresh fruits and vegetables. ? Limit foods that are high in fat and processed sugars, such as fried or sweet foods. Activity  Exercise only as directed by your health care provider. Most people can continue their usual exercise routine  during pregnancy. Try to exercise for 30 minutes at least 5 days a week. Stop exercising if you experience contractions in the uterus.  Stop exercising if you develop pain or cramping in the lower abdomen or lower back.  Avoid heavy lifting.  Do not exercise if it is very hot or humid or if you are at a high altitude.  If you choose to, you may continue to have sex unless your health care provider tells you not to. Relieving pain and discomfort  Take frequent breaks and rest with your legs raised (elevated) if you have leg cramps or low back pain.  Take warm sitz baths to soothe any pain or discomfort caused by hemorrhoids. Use hemorrhoid cream if your health care provider approves.  Wear a supportive bra to prevent discomfort from breast tenderness.  If you develop varicose veins: ? Wear support hose as told by your health care provider. ? Elevate your feet for 15 minutes, 3-4 times a day. ? Limit salt in your diet. Safety  Talk to your health care provider before traveling far distances.  Do not use hot tubs, steam rooms, or saunas.  Wear your seat belt at all times when driving or riding in a car.  Talk with your health care provider if someone is verbally or physically abusive to you. Preparing for birth To prepare for the arrival of your baby:  Take prenatal classes to understand, practice, and ask questions about labor and delivery.  Visit the hospital and tour the maternity area.  Purchase a rear-facing car seat and make sure you know how to install it in your car.  Prepare the baby's room or sleeping area. Make sure to remove all pillows and stuffed animals from the baby's crib to prevent suffocation. General instructions  Avoid cat litter boxes and soil used by cats. These carry germs that can cause birth defects in the baby. If you have a cat, ask someone to clean the litter box for you.  Do not douche or use tampons. Do not use scented sanitary pads.  Do not  use any products that contain nicotine or tobacco, such as cigarettes, e-cigarettes, and chewing tobacco. If you need help quitting, ask your health care provider.  Do not use any herbal remedies, illegal drugs, or medicines that were not prescribed to you. Chemicals in these products can harm your baby.  Do not drink alcohol.  You will have more frequent prenatal exams during the third trimester. During a routine prenatal visit, your health care provider will do a physical exam, perform tests, and discuss your overall health. Keep all follow-up visits. This is important. Where to find more information  American Pregnancy Association: americanpregnancy.org  [employer] of [employer]ians and Gynecologists: https://www.todd-brady.net/  Office on [employer] Health: MightyReward.co.nz Contact a health care provider if you have:  A fever.  Mild pelvic cramps, pelvic pressure, or nagging pain in your abdominal area or lower back.  Vomiting or diarrhea.  Bad-smelling vaginal discharge or foul-smelling urine.  Pain when you urinate.  A headache that does not go away when you take medicine.  Visual changes or see spots in front of your eyes. Get help right away if:  Your water breaks.  You have regular contractions less than 5 minutes apart.  You have spotting or bleeding from your vagina.  You have severe abdominal pain.  You have difficulty breathing.  You have chest pain.  You have fainting spells.  You have not felt your baby move for the time period told by your health care provider.  You have new or increased pain, swelling, or redness in an arm or leg. Summary  The third trimester of pregnancy is from week 28 through week 40 (months 7 through 9).  You may have more problems sleeping. This can be caused by the size of your abdomen, an increased need to urinate, and an increase in your body's metabolism.  You will have more frequent prenatal exams  during the third trimester. Keep all follow-up visits. This is important. This information is not intended to replace advice given to you by your health care provider. Make sure you discuss any questions you have with your health care provider. Document Revised: 05/18/2020 Document Reviewed: 03/24/2020 Elsevier Patient Education  2021 [employer]    Labor Induction Labor induction is when steps are taken to cause a pregnant woman to begin the labor process. Most women go into labor on their own between 37 weeks and 42 weeks of pregnancy. When this does not happen, or when there is a medical need for labor to begin, steps may be taken to induce, or bring on, labor. Labor induction causes a pregnant woman's uterus to contract. It also causes the cervix to soften (ripen), open (dilate), and thin out. Usually, labor is not induced before 39 weeks of pregnancy unless there is a medical reason to do so. When is labor induction considered? Labor induction may be right for you if:  Your pregnancy lasts longer than 41 to 42 weeks.  Your placenta is separating from your uterus (placental abruption).  You have a rupture of membranes and your labor does not begin.  You have health problems, like diabetes or high blood pressure (preeclampsia) during your pregnancy.  Your baby has stopped growing or does not have enough amniotic fluid. Before labor induction begins, your health care provider will consider the following factors:  Your medical condition and the baby's condition.  How many weeks you have been pregnant.  How mature the baby's lungs are.  The condition of your cervix.  The position of the baby.  The size of your birth canal. Tell a health care provider about:  Any allergies you have.  All medicines you are taking, including vitamins, herbs, eye drops, creams, and over-the-counter medicines.  Any problems you or your family members have had with anesthetic medicines.  Any  surgeries you have had.  Any blood disorders you have.  Any medical conditions you have. What are the risks? Generally, this is a safe procedure. However, problems may occur, including:  Failed induction.  Changes in fetal heart rate, such as being too high, too low, or irregular (erratic).  Infection in the mother or the baby.  Increased risk of having a cesarean delivery.  Breaking off (abruption) of the placenta from the uterus. This is rare.  Rupture of the uterus. This is very rare.  Your baby could fail to get  enough blood flow or oxygen. This can be life-threatening. When induction is needed for medical reasons, the benefits generally outweigh the risks. What happens during the procedure? During the procedure, your health care provider will use one of these methods to induce labor:  Stripping the membranes. In this method, the amniotic sac tissue is gently separated from the cervix. This causes the following to happen: ? Your cervix stretches, which in turn causes the release of prostaglandins. ? Prostaglandins induce labor and cause the uterus to contract. ? This procedure is often done in an office visit. You will be sent home to wait for contractions to begin.  Prostaglandin medicine. This medicine starts contractions and causes the cervix to dilate and ripen. This can be taken by mouth (orally) or by being inserted into the vagina (suppository).  Inserting a small, thin tube (catheter) with a balloon into the vagina and then expanding the balloon with water to dilate the cervix.  Breaking the water. In this method, a small instrument is used to make a small hole in the amniotic sac. This eventually causes the amniotic sac to break. Contractions should begin within a few hours.  Medicine to trigger or strengthen contractions. This medicine is given through an IV that is inserted into a vein in your arm. This procedure may vary among health care providers and hospitals.    Where to find more information  March of Dimes: www.marchofdimes.org  [employer] of [employer]stetricians and Gynecologists: www.acog.org Summary  Labor induction causes a pregnant woman's uterus to contract. It also causes the cervix to soften (ripen), open (dilate), and thin out.  Labor is usually not induced before 39 weeks of pregnancy unless there is a medical reason to do so.  When induction is needed for medical reasons, the benefits generally outweigh the risks.  Talk with your health care provider about which methods of labor induction are right for you. This information is not intended to replace advice given to you by your health care provider. Make sure you discuss any questions you have with your health care provider. Document Revised: 09/22/2020 Document Reviewed: 09/22/2020 Elsevier Patient Education  2021 [employer].

## 2021-02-15 NOTE — Progress Notes (Signed)
  [facility] INDUCTION ASSESS[facility] Pamela Mooney June 08, 1981 Medical record #: [id number] Phone #:  Home Phone [phone]  Mobile [phone]    [facility]atal Provider:[facility]ivering [facility] Proposed admission date/time:02/21/2021 Method of induction:Pitocin  Weight: Filed Weights02/23/22 1415Weight:186 lb 7.5 oz (84.6 kg) BMI Body mass index is 29.21 kg/m. HIV Negative HSV Negative EDC Estimated Date of Delivery: 3/15/22based on:LMP  Gestational age on admission: 38 weeks  Gravidity/parity:[id number]  Cervix Score   0 1 2 3   Position Posterior Midposition Anterior   Consistency Firm Medium Soft   Effacement (%) 0-30 40-50 60-70 >80  Dilation (cm) Closed 1-2 3-4 >5  Baby's station -3 -2 -1 +1, +2   Bishop Score:3   Medical induction of labor  select indication(s) below Elective induction ?39 weeks multiparous patient ?39 weeks primiparous patient with Bishop score ?7 ?40 weeks primiparous patient   Medical Indications Adapted from ACOG Committee Opinion #560, "Medically Indicated Late Preterm and Early Term Deliveries," 2013.  PLACENTAL / UTERINE ISSUES FETAL ISSUES MATERNAL ISSUES  ? Placenta previa (36.0-37.6) ? Isoimmunization (37.0-38.6) ? Preeclampsia without severe features or gestational HTN (37.0)  ? Suspected accreta (34.0-35.6) ? Growth Restriction 02-14-1984) ? Preeclampsia with severe features (34.0)  ? Prior classical CD, uterine window, rupture (36.0-37.6) ? Isolated (38.0-39.6) ? Chronic HTN (38.0-39.6)  ? Prior myomectomy (37.0-38.6) ? Concurrent findings (34.0-37.6) ? Cholestasis (37.0)  ? Umbilical vein varix (37.0) ? Growth Restriction (Twins) ? Diabetes  ? Placental abruption (chronic) ? Di-Di Isolated (36.0-37.6) ? Pregestational, controlled (39.0)  OBSTETRIC ISSUES ? Di-Di concurrent findings (32.0-34.6) ? Pregestational, uncontrolled (37.0-39.0)  ? Postdates ? (41 weeks) ? Mo-Di isolated (32.0-34.6)  ? Pregestational, vascular compromise (37.0- 39.0)  ? PPROM (34.0) ? Multiple Gestation ? Gestational, diet controlled (40.0)  ? Hx of IUFD (39.0 weeks) ? Di-Di (38.0-38.6) ? Gestational, med controlled (39.0)  ? Polyhydramnios, mild/moderate; SDV 8-16 or AFI 25-35 (39.0) ? Mo-Di (36.0-37.6) ? Gestational, uncontrolled (38.0-39.0)  ? Oligohydramnios (36.0-37.6); MVP <2 cm    Provider Signature: Eural Holzschuh R Caidyn Henricksen Scheduled 03-12-1986 Date:02/15/2021 2:58 PM   Call [phone] to finalize the induction date/time  [phone] (07/17)

## 2021-02-15 NOTE — Telephone Encounter (Signed)
Spoke with Shirlean Mylar RN at [facility] L&D via phone to reschedule IOL for 02/22/2021 at 0800.  Called patient and confirmed change in dates for IOL per patient request. Patient aware to proceed with COVID-19 testing on Monday, 2/28 and present for IOL on 3/2.

## 2021-02-20 ENCOUNTER — Other Ambulatory Visit: Payer: Self-pay

## 2021-02-20 ENCOUNTER — Other Ambulatory Visit
Admission: RE | Admit: 2021-02-20 | Discharge: 2021-02-20 | Disposition: A | Discharge status: Home / Self Care | Payer: Medicaid Other | Source: Ambulatory Visit | Attending: Obstetrics & Gynecology | Admitting: Obstetrics & Gynecology

## 2021-02-20 DIAGNOSIS — Z20822 Contact with and (suspected) exposure to covid-19: Secondary | ICD-10-CM | POA: Diagnosis not present

## 2021-02-20 DIAGNOSIS — Z01812 Encounter for preprocedural laboratory examination: Secondary | ICD-10-CM | POA: Diagnosis present

## 2021-02-20 NOTE — Telephone Encounter (Signed)
FYI

## 2021-02-21 LAB — SARS CORONAVIRUS 2 (TAT 6-24 HRS): SARS Coronavirus 2: NEGATIVE

## 2021-02-22 ENCOUNTER — Encounter: Payer: Self-pay | Admitting: Obstetrics and Gynecology

## 2021-02-22 ENCOUNTER — Inpatient Hospital Stay: Payer: Medicaid Other | Admitting: Anesthesiology

## 2021-02-22 ENCOUNTER — Other Ambulatory Visit: Payer: Self-pay

## 2021-02-22 ENCOUNTER — Inpatient Hospital Stay
Admission: RE | Admit: 2021-02-22 | Discharge: 2021-02-24 | DRG: 806 | Disposition: A | Discharge status: Home / Self Care | Payer: Medicaid Other | Attending: Obstetrics and Gynecology | Admitting: Obstetrics and Gynecology

## 2021-02-22 DIAGNOSIS — O163 Unspecified maternal hypertension, third trimester: Secondary | ICD-10-CM | POA: Diagnosis present

## 2021-02-22 DIAGNOSIS — O99284 Endocrine, nutritional and metabolic diseases complicating childbirth: Secondary | ICD-10-CM | POA: Diagnosis present

## 2021-02-22 DIAGNOSIS — O99344 Other mental disorders complicating childbirth: Secondary | ICD-10-CM | POA: Diagnosis present

## 2021-02-22 DIAGNOSIS — E063 Autoimmune thyroiditis: Secondary | ICD-10-CM | POA: Diagnosis present

## 2021-02-22 DIAGNOSIS — Z3A38 38 weeks gestation of pregnancy: Secondary | ICD-10-CM | POA: Diagnosis not present

## 2021-02-22 DIAGNOSIS — E039 Hypothyroidism, unspecified: Secondary | ICD-10-CM

## 2021-02-22 DIAGNOSIS — F419 Anxiety disorder, unspecified: Secondary | ICD-10-CM | POA: Diagnosis present

## 2021-02-22 DIAGNOSIS — O365930 Maternal care for other known or suspected poor fetal growth, third trimester, not applicable or unspecified: Secondary | ICD-10-CM | POA: Diagnosis not present

## 2021-02-22 DIAGNOSIS — O99324 Drug use complicating childbirth: Secondary | ICD-10-CM | POA: Diagnosis present

## 2021-02-22 DIAGNOSIS — Z3490 Encounter for supervision of normal pregnancy, unspecified, unspecified trimester: Secondary | ICD-10-CM | POA: Diagnosis present

## 2021-02-22 DIAGNOSIS — O34219 Maternal care for unspecified type scar from previous cesarean delivery: Secondary | ICD-10-CM | POA: Diagnosis not present

## 2021-02-22 DIAGNOSIS — O09529 Supervision of elderly multigravida, unspecified trimester: Secondary | ICD-10-CM

## 2021-02-22 DIAGNOSIS — F1290 Cannabis use, unspecified, uncomplicated: Secondary | ICD-10-CM | POA: Diagnosis present

## 2021-02-22 DIAGNOSIS — O99824 Streptococcus B carrier state complicating childbirth: Secondary | ICD-10-CM | POA: Diagnosis present

## 2021-02-22 DIAGNOSIS — O1002 Pre-existing essential hypertension complicating childbirth: Secondary | ICD-10-CM | POA: Diagnosis present

## 2021-02-22 DIAGNOSIS — F329 Major depressive disorder, single episode, unspecified: Secondary | ICD-10-CM | POA: Diagnosis present

## 2021-02-22 DIAGNOSIS — O0990 Supervision of high risk pregnancy, unspecified, unspecified trimester: Principal | ICD-10-CM

## 2021-02-22 DIAGNOSIS — O99280 Endocrine, nutritional and metabolic diseases complicating pregnancy, unspecified trimester: Secondary | ICD-10-CM

## 2021-02-22 DIAGNOSIS — O365939 Maternal care for other known or suspected poor fetal growth, third trimester, other fetus: Secondary | ICD-10-CM | POA: Diagnosis present

## 2021-02-22 DIAGNOSIS — R0989 Other specified symptoms and signs involving the circulatory and respiratory systems: Secondary | ICD-10-CM

## 2021-02-22 DIAGNOSIS — O99320 Drug use complicating pregnancy, unspecified trimester: Secondary | ICD-10-CM

## 2021-02-22 DIAGNOSIS — Z8759 Personal history of other complications of pregnancy, childbirth and the puerperium: Secondary | ICD-10-CM

## 2021-02-22 LAB — COMPREHENSIVE METABOLIC PANEL
ALT: 13 U/L (ref 0–44)
AST: 20 U/L (ref 15–41)
Albumin: 2.6 g/dL — ABNORMAL LOW (ref 3.5–5.0)
Alkaline Phosphatase: 133 U/L — ABNORMAL HIGH (ref 38–126)
Anion gap: 8 (ref 5–15)
BUN: 7 mg/dL (ref 6–20)
CO2: 20 mmol/L — ABNORMAL LOW (ref 22–32)
Calcium: 9.2 mg/dL (ref 8.9–10.3)
Chloride: 108 mmol/L (ref 98–111)
Creatinine, Ser: 0.49 mg/dL (ref 0.44–1.00)
GFR, Estimated: >60 mL/min (ref >60)
Glucose, Bld: 73 mg/dL (ref 70–99)
Potassium: 3.7 mmol/L (ref 3.5–5.1)
Sodium: 136 mmol/L (ref 135–145)
Total Bilirubin: 0.4 mg/dL (ref 0.3–1.2)
Total Protein: 6.1 g/dL — ABNORMAL LOW (ref 6.5–8.1)

## 2021-02-22 LAB — URINE DRUG SCREEN, QUALITATIVE (ARMC ONLY)
Amphetamines, Ur Screen: NOT DETECTED
Barbiturates, Ur Screen: NOT DETECTED
Benzodiazepine, Ur Scrn: NOT DETECTED
Cannabinoid 50 Ng, Ur ~~LOC~~: NOT DETECTED
Cocaine Metabolite,Ur ~~LOC~~: NOT DETECTED
MDMA (Ecstasy)Ur Screen: NOT DETECTED
Methadone Scn, Ur: NOT DETECTED
Opiate, Ur Screen: NOT DETECTED
Phencyclidine (PCP) Ur S: NOT DETECTED
Tricyclic, Ur Screen: NOT DETECTED

## 2021-02-22 LAB — CBC
HCT: 29.3 % — ABNORMAL LOW (ref 36.0–46.0)
Hemoglobin: 9.7 g/dL — ABNORMAL LOW (ref 12.0–15.0)
MCH: 26.1 pg (ref 26.0–34.0)
MCHC: 33.1 g/dL (ref 30.0–36.0)
MCV: 78.8 fL — ABNORMAL LOW (ref 80.0–100.0)
Platelets: 230 10*3/uL (ref 150–400)
RBC: 3.72 MIL/uL — ABNORMAL LOW (ref 3.87–5.11)
RDW: 13.8 % (ref 11.5–15.5)
WBC: 12.5 10*3/uL — ABNORMAL HIGH (ref 4.0–10.5)
nRBC: 0 % (ref 0.0–0.2)

## 2021-02-22 LAB — PROTEIN / CREATININE RATIO, URINE
Creatinine, Urine: 26 mg/dL
Total Protein, Urine: <6 mg/dL

## 2021-02-22 LAB — CHLAMYDIA/NGC RT PCR (ARMC ONLY)
Chlamydia Tr: NOT DETECTED
N gonorrhoeae: NOT DETECTED

## 2021-02-22 LAB — TYPE AND SCREEN
ABO/RH(D): O POS
Antibody Screen: NEGATIVE

## 2021-02-22 MED ORDER — ACETAMINOPHEN 325 MG PO TABS: 650.0000 mg | ORAL_TABLET | ORAL | Status: DC | PRN

## 2021-02-22 MED ORDER — SOD CITRATE-CITRIC ACID 500-334 MG/5ML PO SOLN
30.0000 mL | ORAL | Status: DC | PRN
  Administered 2021-02-22: 30 mL via ORAL
  Filled 2021-02-22: qty 15

## 2021-02-22 MED ORDER — SIMETHICONE 80 MG PO CHEW: 80.0000 mg | CHEWABLE_TABLET | ORAL | Status: DC | PRN

## 2021-02-22 MED ORDER — HYDRALAZINE HCL 20 MG/ML IJ SOLN: 10.0000 mg | INTRAMUSCULAR | Status: DC | PRN

## 2021-02-22 MED ORDER — TERBUTALINE SULFATE 1 MG/ML IJ SOLN: 0.2500 mg | Freq: Once | INTRAMUSCULAR | Status: DC | PRN

## 2021-02-22 MED ORDER — DIPHENHYDRAMINE HCL 25 MG PO CAPS
25.0000 mg | ORAL_CAPSULE | Freq: Four times a day (QID) | ORAL | Status: DC | PRN
  Administered 2021-02-22: 25 mg via ORAL
  Filled 2021-02-22: qty 1

## 2021-02-22 MED ORDER — BUPIVACAINE HCL (PF) 0.25 % IJ SOLN
INTRAMUSCULAR | Status: DC | PRN
  Administered 2021-02-22: 3 mL via EPIDURAL
  Administered 2021-02-22: 4 mL via EPIDURAL

## 2021-02-22 MED ORDER — FENTANYL 2.5 MCG/ML W/ROPIVACAINE 0.15% IN NS 100 ML EPIDURAL (ARMC)
EPIDURAL | Status: AC
  Filled 2021-02-22: qty 100

## 2021-02-22 MED ORDER — LABETALOL HCL 5 MG/ML IV SOLN: 40.0000 mg | INTRAVENOUS | Status: DC | PRN

## 2021-02-22 MED ORDER — AMMONIA AROMATIC IN INHA
RESPIRATORY_TRACT | Status: AC
  Filled 2021-02-22: qty 10

## 2021-02-22 MED ORDER — ARIPIPRAZOLE 2 MG PO TABS
2.0000 mg | ORAL_TABLET | Freq: Every day | ORAL | Status: DC
  Administered 2021-02-22 – 2021-02-24 (×3): 2 mg via ORAL
  Filled 2021-02-22 (×3): qty 1

## 2021-02-22 MED ORDER — COCONUT OIL OIL: 1.0000 "application " | TOPICAL_OIL | Status: DC | PRN

## 2021-02-22 MED ORDER — LIDOCAINE HCL (PF) 1 % IJ SOLN: 30.0000 mL | INTRAMUSCULAR | Status: DC | PRN

## 2021-02-22 MED ORDER — OXYTOCIN 10 UNIT/ML IJ SOLN
INTRAMUSCULAR | Status: AC
  Filled 2021-02-22: qty 2

## 2021-02-22 MED ORDER — ONDANSETRON HCL 4 MG/2ML IJ SOLN: 4.0000 mg | INTRAMUSCULAR | Status: DC | PRN

## 2021-02-22 MED ORDER — LIDOCAINE-EPINEPHRINE (PF) 1.5 %-1:200000 IJ SOLN
INTRAMUSCULAR | Status: DC | PRN
  Administered 2021-02-22: 3 mL via EPIDURAL

## 2021-02-22 MED ORDER — LACTATED RINGERS IV SOLN: INTRAVENOUS | Status: DC

## 2021-02-22 MED ORDER — OXYCODONE HCL 5 MG PO TABS
10.0000 mg | ORAL_TABLET | ORAL | Status: DC | PRN
  Administered 2021-02-23 – 2021-02-24 (×7): 10 mg via ORAL
  Filled 2021-02-22 (×7): qty 2

## 2021-02-22 MED ORDER — ZOLPIDEM TARTRATE 5 MG PO TABS: 5.0000 mg | ORAL_TABLET | Freq: Every evening | ORAL | Status: DC | PRN

## 2021-02-22 MED ORDER — OXYTOCIN-SODIUM CHLORIDE 30-0.9 UT/500ML-% IV SOLN
2.5000 [IU]/h | INTRAVENOUS | Status: DC
  Administered 2021-02-22: 2.5 [IU]/h via INTRAVENOUS
  Administered 2021-02-22: 36 [IU]/h via INTRAVENOUS

## 2021-02-22 MED ORDER — FENTANYL 2.5 MCG/ML W/ROPIVACAINE 0.15% IN NS 100 ML EPIDURAL (ARMC)
12.0000 mL/h | EPIDURAL | Status: DC
Start: 2021-02-22 — End: 2021-02-22
  Administered 2021-02-22: 12 mL/h via EPIDURAL

## 2021-02-22 MED ORDER — PHENYLEPHRINE 40 MCG/ML (10ML) SYRINGE FOR IV PUSH (FOR BLOOD PRESSURE SUPPORT): 80.0000 ug | PREFILLED_SYRINGE | INTRAVENOUS | Status: DC | PRN

## 2021-02-22 MED ORDER — ONDANSETRON HCL 4 MG/2ML IJ SOLN
4.0000 mg | Freq: Four times a day (QID) | INTRAMUSCULAR | Status: DC | PRN
  Administered 2021-02-22: 4 mg via INTRAVENOUS
  Filled 2021-02-22: qty 2

## 2021-02-22 MED ORDER — LIDOCAINE HCL (PF) 1 % IJ SOLN
INTRAMUSCULAR | Status: DC | PRN
  Administered 2021-02-22: 3 mL via SUBCUTANEOUS

## 2021-02-22 MED ORDER — ACETAMINOPHEN 325 MG PO TABS
650.0000 mg | ORAL_TABLET | ORAL | Status: DC | PRN
  Administered 2021-02-22 – 2021-02-24 (×7): 650 mg via ORAL
  Filled 2021-02-22 (×7): qty 2

## 2021-02-22 MED ORDER — ONDANSETRON HCL 4 MG PO TABS: 4.0000 mg | ORAL_TABLET | ORAL | Status: DC | PRN

## 2021-02-22 MED ORDER — OXYTOCIN BOLUS FROM INFUSION
333.0000 mL | Freq: Once | INTRAVENOUS | Status: AC
  Administered 2021-02-22: 333 mL via INTRAVENOUS

## 2021-02-22 MED ORDER — MISOPROSTOL 200 MCG PO TABS
ORAL_TABLET | ORAL | Status: AC
  Filled 2021-02-22: qty 5

## 2021-02-22 MED ORDER — IBUPROFEN 600 MG PO TABS
600.0000 mg | ORAL_TABLET | Freq: Four times a day (QID) | ORAL | Status: DC
  Administered 2021-02-22 – 2021-02-24 (×7): 600 mg via ORAL
  Filled 2021-02-22 (×7): qty 1

## 2021-02-22 MED ORDER — LABETALOL HCL 5 MG/ML IV SOLN: 20.0000 mg | INTRAVENOUS | Status: DC | PRN

## 2021-02-22 MED ORDER — LEVOTHYROXINE SODIUM 175 MCG PO TABS
175.0000 ug | ORAL_TABLET | Freq: Every day | ORAL | Status: DC
  Administered 2021-02-23 – 2021-02-24 (×2): 175 ug via ORAL
  Filled 2021-02-22 (×3): qty 1

## 2021-02-22 MED ORDER — BUTORPHANOL TARTRATE 1 MG/ML IJ SOLN: 2.0000 mg | INTRAMUSCULAR | Status: DC | PRN

## 2021-02-22 MED ORDER — WITCH HAZEL-GLYCERIN EX PADS: 1.0000 "application " | MEDICATED_PAD | CUTANEOUS | Status: DC | PRN

## 2021-02-22 MED ORDER — MISOPROSTOL 200 MCG PO TABS
ORAL_TABLET | ORAL | Status: AC
  Filled 2021-02-22: qty 4

## 2021-02-22 MED ORDER — BUPROPION HCL ER (XL) 300 MG PO TB24
300.0000 mg | ORAL_TABLET | Freq: Every day | ORAL | Status: DC
  Administered 2021-02-22 – 2021-02-24 (×3): 300 mg via ORAL
  Filled 2021-02-22 (×3): qty 1

## 2021-02-22 MED ORDER — LACTATED RINGERS IV SOLN
500.0000 mL | Freq: Once | INTRAVENOUS | Status: AC
  Administered 2021-02-22: 500 mL via INTRAVENOUS

## 2021-02-22 MED ORDER — HYDRALAZINE HCL 20 MG/ML IJ SOLN: 5.0000 mg | INTRAMUSCULAR | Status: DC | PRN

## 2021-02-22 MED ORDER — OXYTOCIN-SODIUM CHLORIDE 30-0.9 UT/500ML-% IV SOLN
1.0000 m[IU]/min | INTRAVENOUS | Status: DC
  Administered 2021-02-22: 2 m[IU]/min via INTRAVENOUS
  Filled 2021-02-22: qty 1000

## 2021-02-22 MED ORDER — LACTATED RINGERS IV SOLN: 500.0000 mL | INTRAVENOUS | Status: DC | PRN

## 2021-02-22 MED ORDER — EPHEDRINE 5 MG/ML INJ: 10.0000 mg | INTRAVENOUS | Status: DC | PRN

## 2021-02-22 MED ORDER — DIPHENHYDRAMINE HCL 50 MG/ML IJ SOLN: 12.5000 mg | INTRAMUSCULAR | Status: DC | PRN

## 2021-02-22 MED ORDER — LIDOCAINE HCL (PF) 1 % IJ SOLN
INTRAMUSCULAR | Status: AC
  Filled 2021-02-22: qty 30

## 2021-02-22 MED ORDER — SODIUM CHLORIDE 0.9 % IV SOLN
2.0000 g | Freq: Once | INTRAVENOUS | Status: AC
  Administered 2021-02-22: 2 g via INTRAVENOUS
  Filled 2021-02-22: qty 2000

## 2021-02-22 MED ORDER — OXYCODONE HCL 5 MG PO TABS
5.0000 mg | ORAL_TABLET | ORAL | Status: DC | PRN
  Administered 2021-02-23: 5 mg via ORAL
  Filled 2021-02-22: qty 1

## 2021-02-22 MED ORDER — CARBOPROST TROMETHAMINE 250 MCG/ML IM SOLN
INTRAMUSCULAR | Status: AC
  Filled 2021-02-22: qty 1

## 2021-02-22 MED ORDER — PRENATAL MULTIVITAMIN CH
1.0000 | ORAL_TABLET | Freq: Every day | ORAL | Status: DC
  Administered 2021-02-23 – 2021-02-24 (×2): 1 via ORAL
  Filled 2021-02-22 (×2): qty 1

## 2021-02-22 MED ORDER — CALCIUM CARBONATE ANTACID 500 MG PO CHEW
400.0000 mg | CHEWABLE_TABLET | Freq: Three times a day (TID) | ORAL | Status: DC | PRN
  Administered 2021-02-22: 400 mg via ORAL
  Filled 2021-02-22: qty 2

## 2021-02-22 MED ORDER — HYDRALAZINE HCL 20 MG/ML IJ SOLN
INTRAMUSCULAR | Status: AC
  Administered 2021-02-22: 5 mg via INTRAVENOUS
  Filled 2021-02-22: qty 1

## 2021-02-22 MED ORDER — BENZOCAINE-MENTHOL 20-0.5 % EX AERO
1.0000 "application " | INHALATION_SPRAY | CUTANEOUS | Status: DC | PRN
  Administered 2021-02-22: 1 via TOPICAL
  Filled 2021-02-22: qty 56

## 2021-02-22 MED ORDER — DOCUSATE SODIUM 100 MG PO CAPS
100.0000 mg | ORAL_CAPSULE | Freq: Two times a day (BID) | ORAL | Status: DC
  Administered 2021-02-23 – 2021-02-24 (×3): 100 mg via ORAL
  Filled 2021-02-22 (×3): qty 1

## 2021-02-22 MED ORDER — DIBUCAINE (PERIANAL) 1 % EX OINT: 1.0000 "application " | TOPICAL_OINTMENT | CUTANEOUS | Status: DC | PRN

## 2021-02-22 MED ORDER — SODIUM CHLORIDE 0.9 % IV SOLN
1.0000 g | INTRAVENOUS | Status: DC
  Administered 2021-02-22 (×2): 1 g via INTRAVENOUS
  Filled 2021-02-22 (×2): qty 1000

## 2021-02-22 NOTE — Anesthesia Preprocedure Evaluation (Addendum)
Anesthesia Evaluation  Patient identified by MRN, date of birth, ID band Patient awake  General Assessment Comment:TOLAC, 2 successful vaginal deliveries since c/s. 40 y.o. [id number] [redacted]w[redacted]d  for Induction of Labor scheduled due to IUGR .   Pregnancy course has been complicated by hx of cesarean delivery, AMA, IUGR (normal dopplers), hx of IUFD, hx of placental abruption, THC use in pregnancy, Hashimoto's thyroiditis, anxiety, major depressive disorder, and polysubstance abuse.  Reviewed: Allergy & Precautions, H&P , NPO status , Patient's Chart, lab work & pertinent test results  Airway Mallampati: III  TM Distance: >3 FB Neck ROM: full    Dental no notable dental hx. (+) Upper Dentures, Lower Dentures   Pulmonary    Pulmonary exam normal        Cardiovascular Normal cardiovascular exam     Neuro/Psych PSYCHIATRIC DISORDERS Anxiety Depression negative neurological ROS     GI/Hepatic Neg liver ROS, GERD  Medicated and Controlled,  Endo/Other  Hypothyroidism   Renal/GU negative Renal ROS  negative genitourinary   Musculoskeletal   Abdominal   Peds  Hematology negative hematology ROS (+)   Anesthesia Other Findings   Reproductive/Obstetrics (+) Pregnancy                           Anesthesia Physical Anesthesia Plan  ASA: II  Anesthesia Plan:    Post-op Pain Management:    Induction:   PONV Risk Score and Plan:   Airway Management Planned:   Additional Equipment:   Intra-op Plan:   Post-operative Plan:   Informed Consent: I have reviewed the patients History and Physical, chart, labs and discussed the procedure including the risks, benefits and alternatives for the proposed anesthesia with the patient or authorized representative who has indicated his/her understanding and acceptance.     Dental Advisory Given  Plan Discussed with: Anesthesiologist and CRNA  Anesthesia Plan  Comments:        Anesthesia Quick Evaluation

## 2021-02-22 NOTE — Progress Notes (Signed)
Subjective:  Comfortable epidural in place  Objective:   Vitals: Blood pressure 129/77, pulse 92, temperature (!) 97.4 F (36.3 C), temperature source Oral, resp. rate 16, height 5\' 7"  (1.702 m), weight 83.9 kg, last menstrual period 05/31/2020, SpO2 99 %, unknown if currently breastfeeding. General: NAD Abdomen: gravid, non-tender Cervical Exam:  Dilation: 4 Effacement (%): 60 Cervical Position: Posterior Station: -2 Presentation: Vertex Exam by:: J Lunsford RN  FHT: 120, moderate, +accels, no decels Toco: q55min  Results for orders placed or performed during the hospital encounter of 02/22/21 (from the past 24 hour(s))  CBC     Status: Abnormal   Collection Time: 02/22/21  8:31 AM  Result Value Ref Range   WBC 12.5 (H) 4.0 - 10.5 K/uL   RBC 3.72 (L) 3.87 - 5.11 MIL/uL   Hemoglobin 9.7 (L) 12.0 - 15.0 g/dL   HCT 04/24/21 (L) 19.6 - 22.2 %   MCV 78.8 (L) 80.0 - 100.0 fL   MCH 26.1 26.0 - 34.0 pg   MCHC 33.1 30.0 - 36.0 g/dL   RDW 97.9 89.2 - 11.9 %   Platelets 230 150 - 400 K/uL   nRBC 0.0 0.0 - 0.2 %  Type and screen     Status: None   Collection Time: 02/22/21  8:31 AM  Result Value Ref Range   ABO/RH(D) O POS    Antibody Screen NEG    Sample Expiration      02/25/2021,2359 Performed at [facility], [address] [place], [place]   Comprehensive metabolic panel     Status: Abnormal   Collection Time: 02/22/21  8:31 AM  Result Value Ref Range   Sodium 136 135 - 145 mmol/L   Potassium 3.7 3.5 - 5.1 mmol/L   Chloride 108 98 - 111 mmol/L   CO2 20 (L) 22 - 32 mmol/L   Glucose, Bld 73 70 - 99 mg/dL   BUN 7 6 - 20 mg/dL   Creatinine, Ser 04/24/21 0.44 - 1.00 mg/dL   Calcium 9.2 8.9 - 4.81 mg/dL   Total Protein 6.1 (L) 6.5 - 8.1 g/dL   Albumin 2.6 (L) 3.5 - 5.0 g/dL   AST 20 15 - 41 U/L   ALT 13 0 - 44 U/L   Alkaline Phosphatase 133 (H) 38 - 126 U/L   Total Bilirubin 0.4 0.3 - 1.2 mg/dL   GFR, Estimated 85.6 >31 mL/min   Anion gap 8 5 - 15   Urine Drug Screen, Qualitative ([facility] only)     Status: None   Collection Time: 02/22/21  8:56 AM  Result Value Ref Range   Tricyclic, Ur Screen NONE DETECTED NONE DETECTED   Amphetamines, Ur Screen NONE DETECTED NONE DETECTED   MDMA (Ecstasy)Ur Screen NONE DETECTED NONE DETECTED   Cocaine Metabolite,Ur [place] NONE DETECTED NONE DETECTED   Opiate, Ur Screen NONE DETECTED NONE DETECTED   Phencyclidine (PCP) Ur S NONE DETECTED NONE DETECTED   Cannabinoid 50 Ng, Ur [place] NONE DETECTED NONE DETECTED   Barbiturates, Ur Screen NONE DETECTED NONE DETECTED   Benzodiazepine, Ur Scrn NONE DETECTED NONE DETECTED   Methadone Scn, Ur NONE DETECTED NONE DETECTED  Protein / creatinine ratio, urine     Status: None   Collection Time: 02/22/21  8:56 AM  Result Value Ref Range   Creatinine, Urine 26 mg/dL   Total Protein, Urine <6 mg/dL   Protein Creatinine Ratio        0.00 - 0.15 mg/mg[Cre]  Chlamydia/NGC rt  PCR ([facility] only)     Status: None   Collection Time: 02/22/21  8:56 AM  Result Value Ref Range   Specimen source GC/Chlam URINE, RANDOM    Chlamydia Tr NOT DETECTED NOT DETECTED   N gonorrhoeae NOT DETECTED NOT DETECTED    Assessment:   40 y.o. [id number] 103w1d IOL CHTN, IUGR, TOLAC  Plan:   1) Labor - AROM, continue to titrate pitocin   2) Fetus - cat I tracing  3) GBS positive PCN  Vena Austria, MD, [facility], [facility] 02/22/2021, 5:42 PM

## 2021-02-22 NOTE — Progress Notes (Signed)
Labor Progress Note  Pamela Mooney is a 40 y.o. [id number] at [redacted]w[redacted]d by LMP, consistent with 7w [place], admitted for induction of labor due to fetal growth restriction.  Subjective: Patient is resting in a lateral position in bed. Patient denies pain following epidural placement.  Objective: BP (!) 140/93   Pulse 98   Temp (!) 97.3 F (36.3 C) (Oral)   Resp 16   Ht 5\' 7"  (1.702 m)   Wt 83.9 kg   LMP 05/31/2020   SpO2 100%   BMI 28.98 kg/m   Fetal Assessment: FHT:  FHR: 140 bpm, variability: moderate,  accelerations:  Present,  decelerations:  Present intermittent variables and early decelerations noted Category/reactivity:  Category II UC:   Irregular, palpate mild - IUPC placed for improved monitoring SVE:  4/70/-3  Membrane status: AROM Amniotic color: clear  Labs: Lab Results  Component Value Date   WBC 12.5 (H) 02/22/2021   HGB 9.7 (L) 02/22/2021   HCT 29.3 (L) 02/22/2021   MCV 78.8 (L) 02/22/2021   PLT 230 02/22/2021    Assessment / Plan: Induction of labor due to fetal growth restriction,  progressing well on pitocin, s/p AROM - now with IUPC placed  Labor: progressing on pitocin Fetal Wellbeing:  Category II Pain Control:  Epidural I/D:  GBS positive, treating with ampicillin  Anticipated MOD:  NSVD  04/24/2021, CNM 02/22/2021, 7:42 PM

## 2021-02-22 NOTE — H&P (Signed)
History and Physical  Pamela Mooney is a 40 y.o. [id number] [redacted]w[redacted]d  for Induction of Labor scheduled due to IUGR .   Pregnancy course has been complicated by hx of cesarean delivery, AMA, IUGR (normal dopplers), hx of IUFD, hx of placental abruption, THC use in pregnancy, Hashimoto's thyroiditis, anxiety, major depressive disorder, and polysubstance abuse.  Patient denies pain, bleeding, ruptured membranes, or other signs of progressing labor at this time.  PMHx: She  has a past medical history of Anxiety, Depression, Hashimoto's disease, Hypothyroidism, and Thyroid disease. Also,  has a past surgical history that includes Cesarean section (07/31/2005) and Anal fissurectomy., family history is not on file.,  reports that she has never smoked. She has never used smokeless tobacco. She reports previous alcohol use. She reports current drug use. Drug: Marijuana. Current Meds  Medication Sig  . ARIPiprazole (ABILIFY) 2 MG tablet Take 2 mg by mouth daily.  Marland Kitchen buPROPion (WELLBUTRIN XL) 300 MG 24 hr tablet Take 300 mg by mouth every morning.  . clonazePAM (KLONOPIN) 1 MG tablet SMARTSIG:1-2.5 Tablet(s) By Mouth Daily PRN  . levothyroxine (SYNTHROID) 175 MCG tablet Take 1 tablet (175 mcg total) by mouth daily before breakfast.  . ondansetron (ZOFRAN-ODT) 8 MG disintegrating tablet Take 1 tablet (8 mg total) by mouth every 8 (eight) hours as needed for nausea or vomiting.   Also, has No Known Allergies. OB History  Gravida Para Term Preterm AB Living  11 5 5   5 4   SAB IAB Ectopic Multiple Live Births  5       4    # Outcome Date GA Lbr Len/2nd Weight Sex Delivery Anes PTL Lv  11 Current           10 Term 03/23/09    F Vag-Spont   LIV  9 Term 02/22/08    F Vag-Spont   LIV  8 Term 07/31/05    F CS-LTranv   FD     Complications: Abruptio Placenta  7 Term 12/24/00    M Vag-Spont   LIV  6 Term 03/22/99    M    LIV  5 SAB           4 SAB           3 SAB           2 SAB           1 SAB            Patient denies any other pertinent gynecologic issues.   Review of Systems  Constitutional: Negative.   HENT: Negative.   Eyes: Negative.   Respiratory: Negative.   Cardiovascular: Negative.   Gastrointestinal: Negative.   Genitourinary: Negative.   Musculoskeletal: Negative.   Skin: Negative.   Neurological: Negative.   Endo/Heme/Allergies: Negative.   Psychiatric/Behavioral: Negative.     Objective: BP (!) 148/93   Pulse 97   Temp (!) 97.5 F (36.4 C) (Oral)   Resp 16   Ht 5\' 7"  (1.702 m)   Wt 83.9 kg   LMP 05/31/2020   BMI 28.98 kg/m  Physical Exam Constitutional:      Appearance: Normal appearance.  Genitourinary:     Genitourinary Comments: SVE: 1/70/-3  Attempted to place foley bulb catheter for mechanical dilation, patient unable to tolerate procedure  HENT:     Head: Normocephalic.  Eyes:     Pupils: Pupils are equal, round, and reactive to light.  Cardiovascular:     Rate and  Rhythm: Normal rate and regular rhythm.     Heart sounds: Normal heart sounds.  Pulmonary:     Effort: Pulmonary effort is normal.     Breath sounds: Normal breath sounds.  Abdominal:     Comments: Gravid - Leopolds: vtx, EFW - 6#  Musculoskeletal:        General: Normal range of motion.     Cervical back: Normal range of motion.  Neurological:     Mental Status: She is alert and oriented to person, place, and time.  Skin:    General: Skin is warm and dry.  Psychiatric:        Behavior: Behavior normal.     Comments: Patient tearful, reports anxiety due to traumatic delivery experiences     Assessment: Term Pregnancy for Induction of Labor due to IUGR.  Plan: Patient will undergo induction of labor with pitocin.     Patient has been fully informed of the pros and cons, risks and benefits of continued observation with fetal monitoring versus that of induction of labor.   She understands that there are uncommon risks to induction, which include but are not limited to :  frequent or prolonged uterine contractions, fetal distress, uterine rupture, and lack of successful induction.  These risks include all methods including Pitocin and Misoprostol.  Patient understands that using Misoprostol for labor induction is an "off label" indication although it has been studied extensively for this purpose and is an accepted method of induction.  She also has been informed of the increased risks for Cesarean with induction and should induction not be successful.  Patient consents to the induction plan of management.  Plans to bottle feed Plans condoms for contraception TDaP UTD - 04/05/20  Zipporah Plants, CNM, MSN [facility], [facility] 02/22/2021  10:11 AM

## 2021-02-22 NOTE — Anesthesia Procedure Notes (Signed)
Epidural Patient location during procedure: OB Start time: 02/22/2021 1:10 PM End time: 02/22/2021 1:25 PM  Staffing Anesthesiologist: Corinda Gubler, MD Resident/CRNA: Karoline Caldwell, CRNA Performed: resident/CRNA   Preanesthetic Checklist Completed: patient identified, IV checked, site marked, risks and benefits discussed, surgical consent, monitors and equipment checked and pre-op evaluation  Epidural Patient position: sitting Prep: ChloraPrep Patient monitoring: heart rate, continuous pulse ox and blood pressure Approach: midline Location: L2-L3 Injection technique: LOR air  Needle:  Needle type: Tuohy  Needle gauge: 17 G Needle length: 9 cm and 9 Needle insertion depth: 7 cm Catheter type: closed end flexible Catheter size: 19 Gauge Catheter at skin depth: 12 cm Test dose: negative and 1.5% lidocaine with Epi 1:200 K  Assessment Events: blood not aspirated, injection not painful, no injection resistance, no paresthesia and negative IV test  Additional Notes 2 attempts Pt. Evaluated and documentation done after procedure finished. Patient identified. Risks/Benefits/Options discussed with patient including but not limited to bleeding, infection, nerve damage, paralysis, failed block, incomplete pain control, headache, blood pressure changes, nausea, vomiting, reactions to medication both or allergic, itching and postpartum back pain. Confirmed with bedside nurse the patient's most recent platelet count. Confirmed with patient that they are not currently taking any anticoagulation, have any bleeding history or any family history of bleeding disorders. Patient expressed understanding and wished to proceed. All questions were answered. Sterile technique was used throughout the entire procedure. Please see nursing notes for vital signs. Test dose was given through epidural catheter and negative prior to continuing to dose epidural or start infusion. Warning signs of high block given to the  patient including shortness of breath, tingling/numbness in hands, complete motor block, or any concerning symptoms with instructions to call for help. Patient was given instructions on fall risk and not to get out of bed. All questions and concerns addressed with instructions to call with any issues or inadequate analgesia.   Patient tolerated the insertion well without immediate complications.Reason for block:procedure for pain

## 2021-02-22 NOTE — Progress Notes (Signed)
Subjective:  No concerns, is noting increasing contractions  Objective:   Vitals: Blood pressure (!) 151/92, pulse 92, temperature 98.3 F (36.8 C), temperature source Oral, resp. rate 16, height 5\' 7"  (1.702 m), weight 83.9 kg, last menstrual period 05/31/2020, unknown if currently breastfeeding. General: NAD Abdomen:gravid, non-tender Cervical Exam:  Dilation: 3 Effacement (%): 70 Cervical Position: Posterior Station: -3 Presentation: Vertex Exam by:: Benjimin Hadden MD AROM clear  FHT: 140, moderate, +accels, no decels Toco: q3-79min  Results for orders placed or performed during the hospital encounter of 02/22/21 (from the past 24 hour(s))  CBC     Status: Abnormal   Collection Time: 02/22/21  8:31 AM  Result Value Ref Range   WBC 12.5 (H) 4.0 - 10.5 K/uL   RBC 3.72 (L) 3.87 - 5.11 MIL/uL   Hemoglobin 9.7 (L) 12.0 - 15.0 g/dL   HCT 04/24/21 (L) 99.2 - 42.6 %   MCV 78.8 (L) 80.0 - 100.0 fL   MCH 26.1 26.0 - 34.0 pg   MCHC 33.1 30.0 - 36.0 g/dL   RDW 83.4 19.6 - 22.2 %   Platelets 230 150 - 400 K/uL   nRBC 0.0 0.0 - 0.2 %  Type and screen     Status: None   Collection Time: 02/22/21  8:31 AM  Result Value Ref Range   ABO/RH(D) O POS    Antibody Screen NEG    Sample Expiration      02/25/2021,2359 Performed at [facility], [address] [place], [place]   Comprehensive metabolic panel     Status: Abnormal   Collection Time: 02/22/21  8:31 AM  Result Value Ref Range   Sodium 136 135 - 145 mmol/L   Potassium 3.7 3.5 - 5.1 mmol/L   Chloride 108 98 - 111 mmol/L   CO2 20 (L) 22 - 32 mmol/L   Glucose, Bld 73 70 - 99 mg/dL   BUN 7 6 - 20 mg/dL   Creatinine, Ser 04/24/21 0.44 - 1.00 mg/dL   Calcium 9.2 8.9 - 9.41 mg/dL   Total Protein 6.1 (L) 6.5 - 8.1 g/dL   Albumin 2.6 (L) 3.5 - 5.0 g/dL   AST 20 15 - 41 U/L   ALT 13 0 - 44 U/L   Alkaline Phosphatase 133 (H) 38 - 126 U/L   Total Bilirubin 0.4 0.3 - 1.2 mg/dL   GFR, Estimated 74.0 >81 mL/min   Anion  gap 8 5 - 15  Urine Drug Screen, Qualitative ([facility] only)     Status: None   Collection Time: 02/22/21  8:56 AM  Result Value Ref Range   Tricyclic, Ur Screen NONE DETECTED NONE DETECTED   Amphetamines, Ur Screen NONE DETECTED NONE DETECTED   MDMA (Ecstasy)Ur Screen NONE DETECTED NONE DETECTED   Cocaine Metabolite,Ur [place] NONE DETECTED NONE DETECTED   Opiate, Ur Screen NONE DETECTED NONE DETECTED   Phencyclidine (PCP) Ur S NONE DETECTED NONE DETECTED   Cannabinoid 50 Ng, Ur [place] NONE DETECTED NONE DETECTED   Barbiturates, Ur Screen NONE DETECTED NONE DETECTED   Benzodiazepine, Ur Scrn NONE DETECTED NONE DETECTED   Methadone Scn, Ur NONE DETECTED NONE DETECTED  Protein / creatinine ratio, urine     Status: None   Collection Time: 02/22/21  8:56 AM  Result Value Ref Range   Creatinine, Urine 26 mg/dL   Total Protein, Urine <6 mg/dL   Protein Creatinine Ratio        0.00 - 0.15 mg/mg[Cre]  Chlamydia/NGC rt PCR (  [facility] only)     Status: None   Collection Time: 02/22/21  8:56 AM  Result Value Ref Range   Specimen source GC/Chlam URINE, RANDOM    Chlamydia Tr NOT DETECTED NOT DETECTED   N gonorrhoeae NOT DETECTED NOT DETECTED    Assessment:   40 y.o. [id number] [redacted]w[redacted]d IOL TOLAC, IUGR, CHTN  Plan:   1) Labor - AROM clear, continue to titrate pitocin, IUPC if needed  2) Fetus - cat I tracing  3) GBS positive - PCN ppx  Vena Austria, MD, [facility], [facility] 02/22/2021, 12:44 PM

## 2021-02-22 NOTE — Discharge Summary (Signed)
OB Discharge Summary     Patient Name: Pamela Mooney DOB: [birth year] MRN: [id number]  Date of admission: 02/22/2021 Delivering MD: Mirna Mires, CNM  Date of Delivery: 02/22/2021  Date of discharge: 02/24/2021  Admitting diagnosis: IUGR (intrauterine growth restriction) affecting care of mother, third trimester, other fetus [O36.5939] Intrauterine pregnancy: [redacted]w[redacted]d     Secondary diagnosis: TOLAC and elevated blood pressure complicating pregnancy in third trimester     Discharge diagnosis: Term Pregnancy Delivered                                                                                                Post partum procedures:none  Augmentation: AROM and Pitocin  Complications: None  Hospital course:  Induction of Labor With Vaginal Delivery   40 y.o. yo [id number] at [redacted]w[redacted]d was admitted to the hospital 02/22/2021 for induction of labor.  Indication for induction: fetal growth restriction.  Patient had an uncomplicated labor course as follows: Membrane Rupture Time/Date: 12:35 PM ,02/22/2021   Delivery Method:Vaginal, Spontaneous  Episiotomy: None  Lacerations:  None  Details of delivery can be found in separate delivery note.  Patient had a routine postpartum course. Patient is discharged home 02/24/21.  Newborn Data: Birth date:02/22/2021  Birth time:8:18 PM  Gender:Female  Living status:Living  Apgars:9 ,9  Weight:2630 g   Physical exam  Vitals:   02/24/21 0413 02/24/21 0746 02/24/21 0953 02/24/21 1050  BP: (!) 139/93 (!) 148/99 (!) 149/100 136/85  Pulse: 92 75 84 73  Resp: 18 18    Temp: 98.2 F (36.8 C) 97.9 F (36.6 C)    TempSrc: Oral Oral    SpO2: 100% 100%    Weight:      Height:       General: alert, cooperative and no distress Lochia: appropriate Uterine Fundus: firm Incision: N/A, she does have bruising on her back from multiple epidural injections DVT Evaluation: No evidence of DVT seen on physical exam. Negative Homan's sign.  Labs: Lab Results   Component Value Date   WBC 17.5 (H) 02/23/2021   HGB 8.8 (L) 02/23/2021   HCT 26.9 (L) 02/23/2021   MCV 80.1 02/23/2021   PLT 204 02/23/2021    Discharge instruction: per After Visit Summary.  Medications:  Allergies as of 02/24/2021   No Known Allergies     Medication List    TAKE these medications   ARIPiprazole 2 MG tablet Commonly known as: ABILIFY Take 2 mg by mouth daily.   Blood Pressure Monitor/L Cuff Misc 1 Device by Does not apply route once as needed for up to 1 dose. Monitor blood pressure brachial ( upper arm) once daily and keep log.   buPROPion 300 MG 24 hr tablet Commonly known as: WELLBUTRIN XL Take 300 mg by mouth every morning.   clonazePAM 1 MG tablet Commonly known as: KLONOPIN SMARTSIG:1-2.5 Tablet(s) By Mouth Daily PRN   ibuprofen 600 MG tablet Commonly known as: ADVIL Take 1 tablet (600 mg total) by mouth every 6 (six) hours.   levothyroxine 175 MCG tablet Commonly known as: SYNTHROID Take 1 tablet (175 mcg total) by mouth  daily before breakfast.   ondansetron 8 MG disintegrating tablet Commonly known as: ZOFRAN-ODT Take 1 tablet (8 mg total) by mouth every 8 (eight) hours as needed for nausea or vomiting.   Pulse Oximeter Misc 1 Device by Does not apply route daily as needed. Monitor heart rate and oxygen saturation once daily and keep log. Leave on finger for one minute before recording.   traZODone 100 MG tablet Commonly known as: DESYREL Take 100 mg by mouth at bedtime.       Diet: routine diet  Activity: Advance as tolerated. Pelvic rest for 6 weeks.   Outpatient follow up:  Follow-up Information    Zipporah Plants, CNM. Schedule an appointment as soon as possible for a visit.   Specialty: Obstetrics and Gynecology Why:  1 week for BP and mood check.  6 weeks for rountine postpartum evaluation. Contact information: [address] [place] [address] [phone]                 Postpartum contraception:  Condoms Rhogam Given postpartum: n/a Rubella vaccine given postpartum: no - immune Varicella vaccine given postpartum: no - immune TDaP given antepartum or postpartum: UTD - 04/05/20 Feeding: Bottle  Disposition:home with mother  SIGNED: Mirna Mires, CNM  02/24/2021 11:59 AM

## 2021-02-23 LAB — CBC
HCT: 26.9 % — ABNORMAL LOW (ref 36.0–46.0)
Hemoglobin: 8.8 g/dL — ABNORMAL LOW (ref 12.0–15.0)
MCH: 26.2 pg (ref 26.0–34.0)
MCHC: 32.7 g/dL (ref 30.0–36.0)
MCV: 80.1 fL (ref 80.0–100.0)
Platelets: 204 10*3/uL (ref 150–400)
RBC: 3.36 MIL/uL — ABNORMAL LOW (ref 3.87–5.11)
RDW: 13.6 % (ref 11.5–15.5)
WBC: 17.5 10*3/uL — ABNORMAL HIGH (ref 4.0–10.5)
nRBC: 0 % (ref 0.0–0.2)

## 2021-02-23 LAB — RPR: RPR Ser Ql: NONREACTIVE

## 2021-02-23 MED ORDER — LABETALOL HCL 100 MG PO TABS
100.0000 mg | ORAL_TABLET | Freq: Two times a day (BID) | ORAL | Status: DC
  Administered 2021-02-23 – 2021-02-24 (×3): 100 mg via ORAL
  Filled 2021-02-23 (×3): qty 1

## 2021-02-23 MED ORDER — METHYLERGONOVINE MALEATE 0.2 MG/ML IJ SOLN
INTRAMUSCULAR | Status: AC
  Filled 2021-02-23: qty 1

## 2021-02-23 NOTE — Progress Notes (Signed)
Called to patient's bedside by RN to evaluate patient due to increased vaginal bleeding. Upon arrival to room patient was resting in bed with large clot noted on peri pad. No active bleeding appreciated from vagina. Per RN and patient report patient had experienced a "gush" as she prepared to stand and noted the clot on her pad. RN initiated a pitocin bolus through IV for 200 ml prior to provider's arrival at bedside. Patient alert and oriented. Fundus was massaged and noted to be firm, midline, and two fingerbreadth's below the umbilicus. Quantitative measurement of the pad indicated 265 ml of blood loss. VSS, see flowsheet. Reviewed findings with nursing, plan made to continue to monitor closely.    Zipporah Plants, CNM, MSN [facility], [facility] 02/22/2021 00:09 AM

## 2021-02-23 NOTE — TOC Initial Note (Signed)
Transition of Care [facility]) - Initial/Assessment Note    Patient Details  Name: Pamela Mooney MRN: [id number] Date of Birth: Sep 11, 1981  Transition of Care [employer]) CM/SW Contact:    Fillmore Cellar, RN Phone Number: 02/23/2021, 4:58 PM  Clinical Narrative:                  Spoke to patient at bedside. Patient reports she has 2 other children at home ages 12/13 who will be assisting as well as her husband has flexibility to assist as needed. Husband will be returning to work next week but has flexibility with his schedule. Patient is active with "[facility]" therapist and is being followed closely for PPD, anxiety and depression. Patient reports she does Marijuana for her anxiety but does not smoke around the children. Infant was negative UDS. Patient is a [facility] and will be formula feeding. Active with WIC services. Has pediatrician selected and will make appointment prior to discharge. No other needs or concerns.          Patient Goals and CMS Choice        Expected Discharge Plan and Services                                                Prior Living Arrangements/Services                       Activities of Daily Living Home Assistive Devices/Equipment: None ADL Screening (condition at time of admission) Patient's cognitive ability adequate to safely complete daily activities?: Yes Is the patient deaf or have difficulty hearing?: No Does the patient have difficulty seeing, even when wearing glasses/contacts?: No Does the patient have difficulty concentrating, remembering, or making decisions?: No Patient able to express need for assistance with ADLs?: Yes Does the patient have difficulty dressing or bathing?: No Independently performs ADLs?: Yes (appropriate for developmental age) Does the patient have difficulty walking or climbing stairs?: No Weakness of Legs: None Weakness of Arms/Hands: None  Permission Sought/Granted                   Emotional Assessment              Admission diagnosis:  IUGR (intrauterine growth restriction) affecting care of mother, third trimester, other fetus [O36.5939] Patient Active Problem List   Diagnosis Date Noted  . IUGR (intrauterine growth restriction) affecting care of mother, third trimester, other fetus 02/22/2021  . Encounter for induction of labor 02/22/2021  . Elevated blood pressure complicating pregnancy in third trimester, antepartum 02/22/2021  . Vaginal delivery 02/22/2021  . Postpartum care and examination 02/22/2021  . Supervision of high risk pregnancy, antepartum 07/12/2020  . Substance abuse affecting pregnancy, antepartum 07/12/2020  . History of cesarean delivery, antepartum 07/12/2020  . History of IUFD 07/12/2020  . Labile blood pressure 07/12/2020  . History of placental abruption 07/12/2020  . Advanced maternal age in multigravida, unspecified trimester 07/12/2020  . Blood pressure elevated without history of HTN 04/27/2020  . Marijuana use 04/27/2020  . Hashimoto's thyroiditis 04/27/2020  . Anxiety 04/27/2020  . MDD (major depressive disorder), recurrent episode, moderate (HCC) 08/19/2019  . Hypothyroidism affecting pregnancy, antepartum 07/14/2018   PCP:  Berniece Pap, FNP Pharmacy:   [facility] [address] [place] - [phone] [phone] [place]  [place] [address] Phone: [phone] Fax: [phone]  [facility] - [place], [place] - [address] [place] [address] Phone: [phone] Fax: [phone]     Social Determinants of Health (SDOH) Interventions    Readmission Risk Interventions No flowsheet data found.

## 2021-02-23 NOTE — Anesthesia Postprocedure Evaluation (Signed)
Anesthesia Post Note  Patient: Pamela Mooney  Procedure(s) Performed: AN AD HOC LABOR EPIDURAL  Patient location during evaluation: Mother Baby Anesthesia Type: Epidural Level of consciousness: awake and alert Pain management: pain level controlled Vital Signs Assessment: post-procedure vital signs reviewed and stable Respiratory status: spontaneous breathing, nonlabored ventilation and respiratory function stable Cardiovascular status: stable Postop Assessment: no headache, no backache and epidural receding Anesthetic complications: no   No complications documented.   Last Vitals:  Vitals:   02/23/21 0336 02/23/21 0838  BP: (!) 148/96 (!) 146/98  Pulse: 89 83  Resp:  18  Temp: 36.6 C 36.5 C  SpO2: 97% 100%    Last Pain:  Vitals:   02/23/21 0838  TempSrc: Oral  PainSc:                  Karoline Caldwell

## 2021-02-23 NOTE — Progress Notes (Signed)
Called to room by patient due to "several gushes" she was feeling. RN noted a firm fundal massage with small amount of bleeding. When patient stood up to go to bathroom, a fist-sized clot with a large amount of bleeding was noted. RN put patient back to bed and noted a firm fundal massage with a small amount of bleeding. 200cc pitocin bolus initiated. CNM called to bedside. QBL 265. Vital signs WNL. Will continue to monitor closely.

## 2021-02-23 NOTE — Progress Notes (Signed)
Post Partum Day 1 Subjective: up ad lib, voiding, tolerating PO and she denies any headache, visual changes or edema. she is tearful at several points in the visit.  Tears up spontaneously, and expresses anxiety about her partner starting vback at work next week. Limited social support once home.  Objective: Blood pressure (!) 146/98, pulse 83, temperature 97.7 F (36.5 C), temperature source Oral, resp. rate 18, height 5\' 7"  (1.702 m), weight 83.9 kg, last menstrual period 05/31/2020, SpO2 100 %, unknown if currently breastfeeding.  Physical Exam:  General: alert, cooperative, fatigued and moderate distress Lochia: appropriate Uterine Fundus: firm Incision: intact perineum without edema. DVT Evaluation: No evidence of DVT seen on physical exam. Negative Homan's sign. No cords or calf tenderness.  Recent Labs    02/22/21 0831 02/23/21 0341  HGB 9.7* 8.8*  HCT 29.3* 26.9*    Assessment/Plan: Plan for discharge tomorrow and Social Work consult  Will start her on Labetalol 100 mg today and recheck blood pressures. Discussed the import of following up for BP checks at [facility] af ew days after her discharge. Social work consultation due to her hx of DA, and mood issues. She has declined the assistance of the [facility].  Engineer, mining, CNM  02/23/2021 9:59 AM     LOS: 1 day   04/25/2021 02/23/2021, 9:54 AM

## 2021-02-24 ENCOUNTER — Other Ambulatory Visit: Payer: Self-pay | Admitting: Obstetrics

## 2021-02-24 DIAGNOSIS — O165 Unspecified maternal hypertension, complicating the puerperium: Secondary | ICD-10-CM

## 2021-02-24 MED ORDER — IBUPROFEN 600 MG PO TABS: 600.0000 mg | ORAL_TABLET | Freq: Four times a day (QID) | ORAL | 0 refills | Status: DC

## 2021-02-24 MED ORDER — LABETALOL HCL 200 MG PO TABS
200.0000 mg | ORAL_TABLET | Freq: Two times a day (BID) | ORAL | Status: DC
  Administered 2021-02-24: 100 mg via ORAL

## 2021-02-24 MED ORDER — LABETALOL HCL 200 MG PO TABS: 200.0000 mg | ORAL_TABLET | Freq: Two times a day (BID) | ORAL | 2 refills | Status: AC

## 2021-02-24 NOTE — Progress Notes (Signed)
Discharge order received from doctor. Reviewed discharge instructions and prescriptions with patient and answered all questions. Follow up appointment instructions given. Patient verbalized understanding. ID bands checked. Patient discharged home with infant via wheelchair by nursing/auxillary.  

## 2021-02-28 IMAGING — CR DG CHEST 2V
1 series · 2 of 2 positions shown · non-contrast
Comparison: None.

CLINICAL DATA: Wheezing on exam in the upper lungs.

EXAM:
CHEST - 2 VIEW

[Series 1: dg chest 2 view · 0.14mm/px · 2 of 2 slices shown]
[im 1/2]
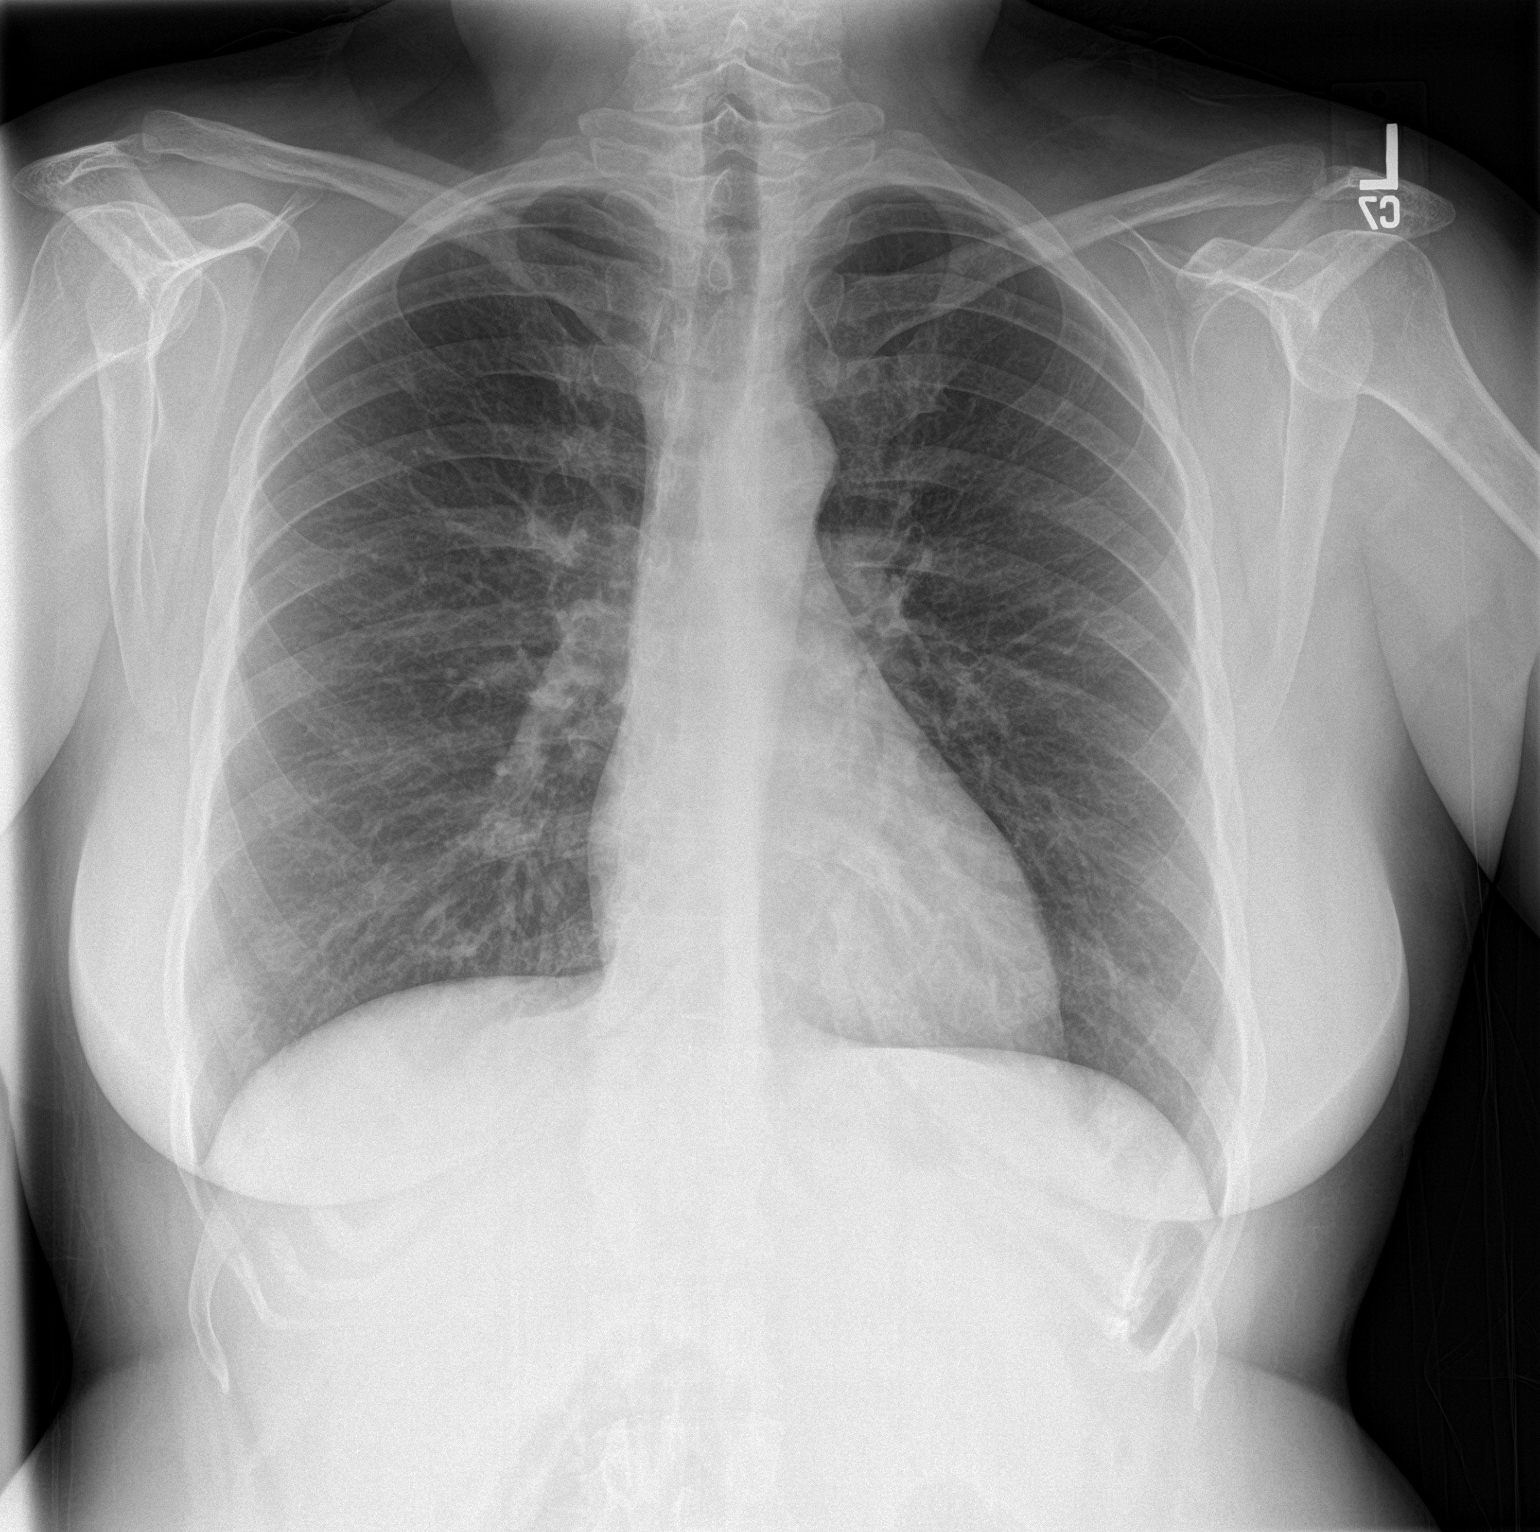
[im 2/2]
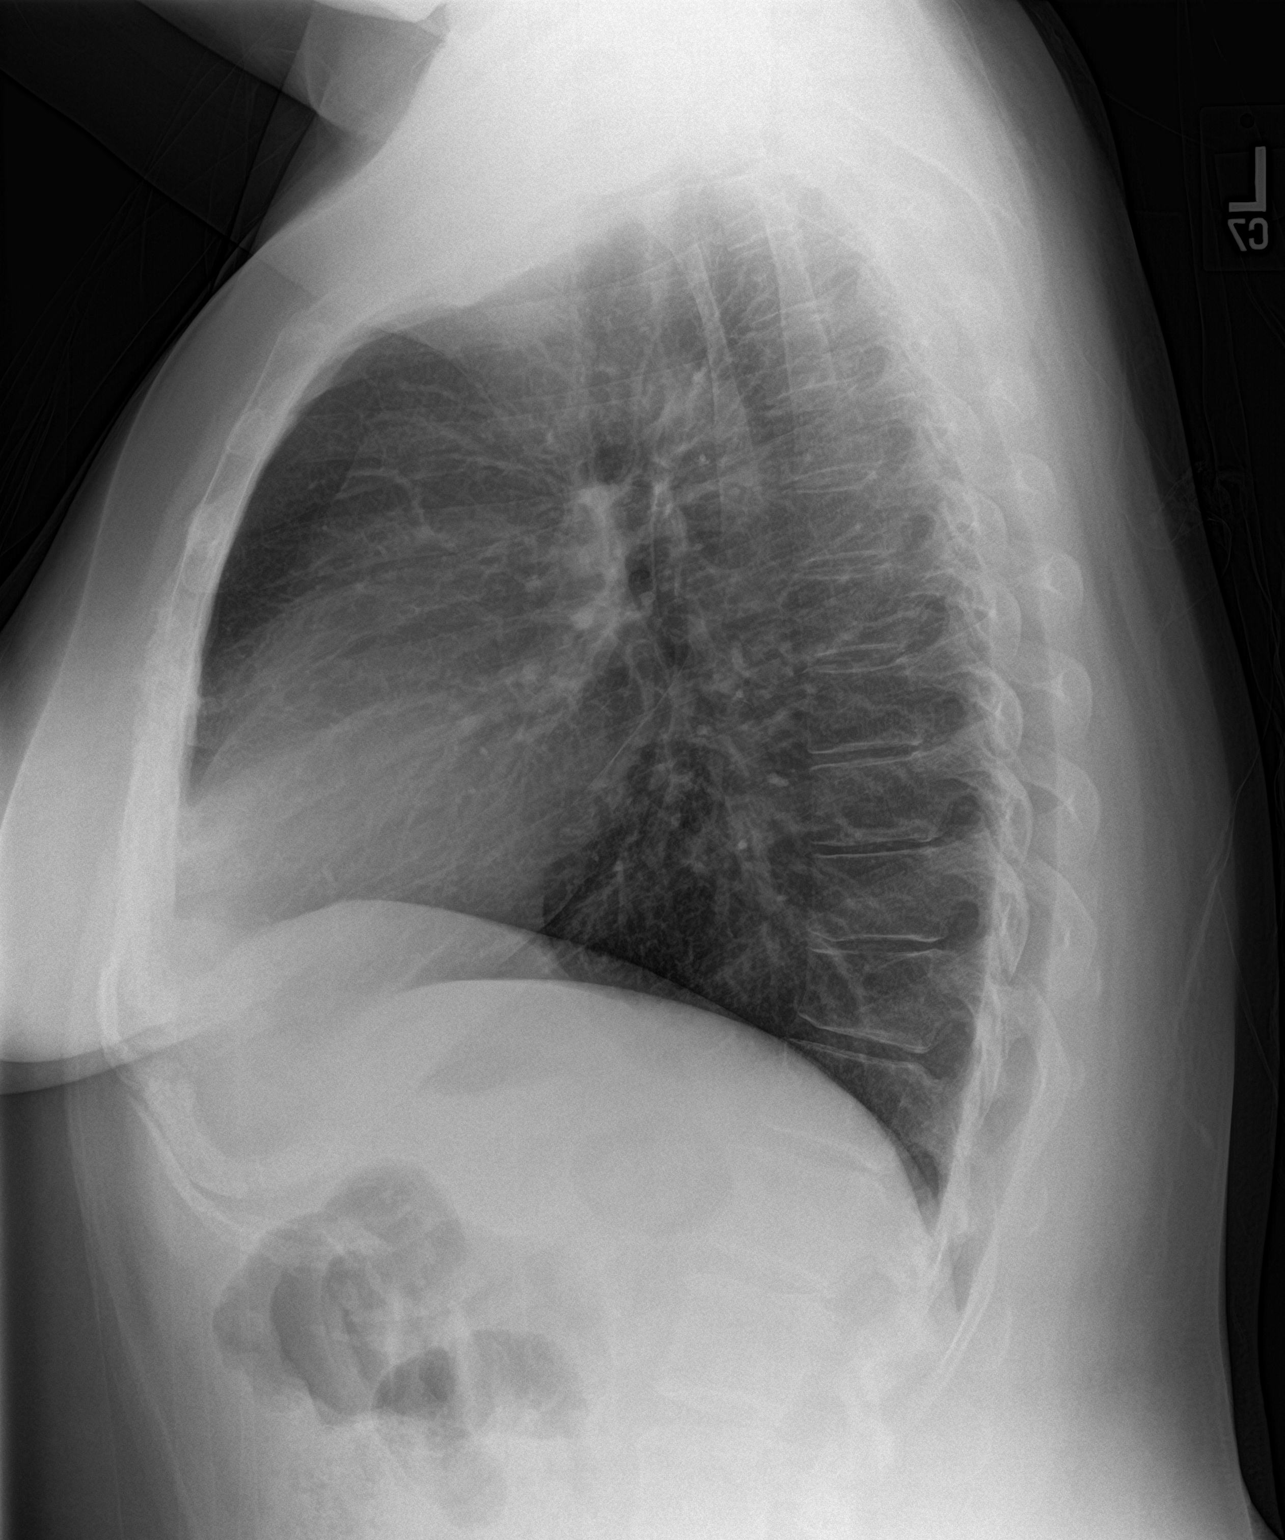

[2 of 2 positions shown; findings below may reference images not displayed]

FINDINGS: Lungs are adequately inflated and otherwise clear. Cardiomediastinal
silhouette and remainder of the exam is unremarkable.
IMPRESSION: No active cardiopulmonary disease.

## 2021-03-01 NOTE — Progress Notes (Deleted)
      Established patient visit   Patient: Pamela Mooney   DOB: 07-16-1981   40 y.o. Female  MRN: [id number] Visit Date: 03/02/2021  Today's healthcare provider: Jairo Ben, FNP   No chief complaint on file.  Subjective    Back Pain This is a new problem.    ***  {Show patient history (optional):23778::" "}   Medications: Outpatient Medications Prior to Visit  Medication Sig  . ARIPiprazole (ABILIFY) 2 MG tablet Take 2 mg by mouth daily.  . Blood Pressure Monitoring (BLOOD PRESSURE MONITOR/L CUFF) MISC 1 Device by Does not apply route once as needed for up to 1 dose. Monitor blood pressure brachial ( upper arm) once daily and keep log.  Marland Kitchen buPROPion (WELLBUTRIN XL) 300 MG 24 hr tablet Take 300 mg by mouth every morning.  . clonazePAM (KLONOPIN) 1 MG tablet SMARTSIG:1-2.5 Tablet(s) By Mouth Daily PRN  . ibuprofen (ADVIL) 600 MG tablet Take 1 tablet (600 mg total) by mouth every 6 (six) hours.  Marland Kitchen labetalol (NORMODYNE) 200 MG tablet Take 1 tablet (200 mg total) by mouth 2 (two) times daily.  Marland Kitchen levothyroxine (SYNTHROID) 175 MCG tablet Take 1 tablet (175 mcg total) by mouth daily before breakfast.  . Misc. Devices (PULSE OXIMETER) MISC 1 Device by Does not apply route daily as needed. Monitor heart rate and oxygen saturation once daily and keep log. Leave on finger for one minute before recording.  . ondansetron (ZOFRAN-ODT) 8 MG disintegrating tablet Take 1 tablet (8 mg total) by mouth every 8 (eight) hours as needed for nausea or vomiting.  . traZODone (DESYREL) 100 MG tablet Take 100 mg by mouth at bedtime. (Patient not taking: No sig reported)   No facility-administered medications prior to visit.    Review of Systems  Musculoskeletal: Positive for back pain.    {Labs  Heme  Chem  Endocrine  Serology  Results Review (optional):23779::" "}   Objective    LMP 05/31/2020  {Show previous vital signs (optional):23777::" "}   Physical Exam  ***  No  results found for any visits on 03/02/21.  Assessment & Plan     ***  No follow-ups on file.      {provider attestation***:1}   Jairo Ben, FNP  [facility] [phone] (phone) [phone] (fax)  [facility]

## 2021-03-02 ENCOUNTER — Ambulatory Visit: Payer: Medicaid Other | Admitting: Adult Health

## 2021-03-03 ENCOUNTER — Ambulatory Visit (INDEPENDENT_AMBULATORY_CARE_PROVIDER_SITE_OTHER): Payer: Medicaid Other | Admitting: Adult Health

## 2021-03-03 DIAGNOSIS — Z5329 Procedure and treatment not carried out because of patient's decision for other reasons: Secondary | ICD-10-CM

## 2021-03-03 NOTE — Progress Notes (Signed)
      Established patient visit   Patient: Pamela Mooney   DOB: [birth year]   40 y.o. Female  MRN: [id number] Visit Date: 03/03/2021  Today's healthcare provider: Jairo Ben, FNP   NO SHOW FOR APPOINTMENT.

## 2021-03-04 DIAGNOSIS — Z91199 Patient's noncompliance with other medical treatment and regimen due to unspecified reason: Secondary | ICD-10-CM | POA: Insufficient documentation

## 2021-03-04 DIAGNOSIS — Z5329 Procedure and treatment not carried out because of patient's decision for other reasons: Secondary | ICD-10-CM | POA: Insufficient documentation

## 2021-03-08 IMAGING — CR DG CHEST 2V
1 series · 2 of 2 positions shown · non-contrast
Comparison: 04/27/2020

CLINICAL DATA: Shortness of breath, nausea, wheezing, and tightness
in chest for 3 weeks

EXAM:
CHEST - 2 VIEW

[Series 1: dg chest 2 view · 0.14mm/px · 2 of 2 slices shown]
[im 1/2]
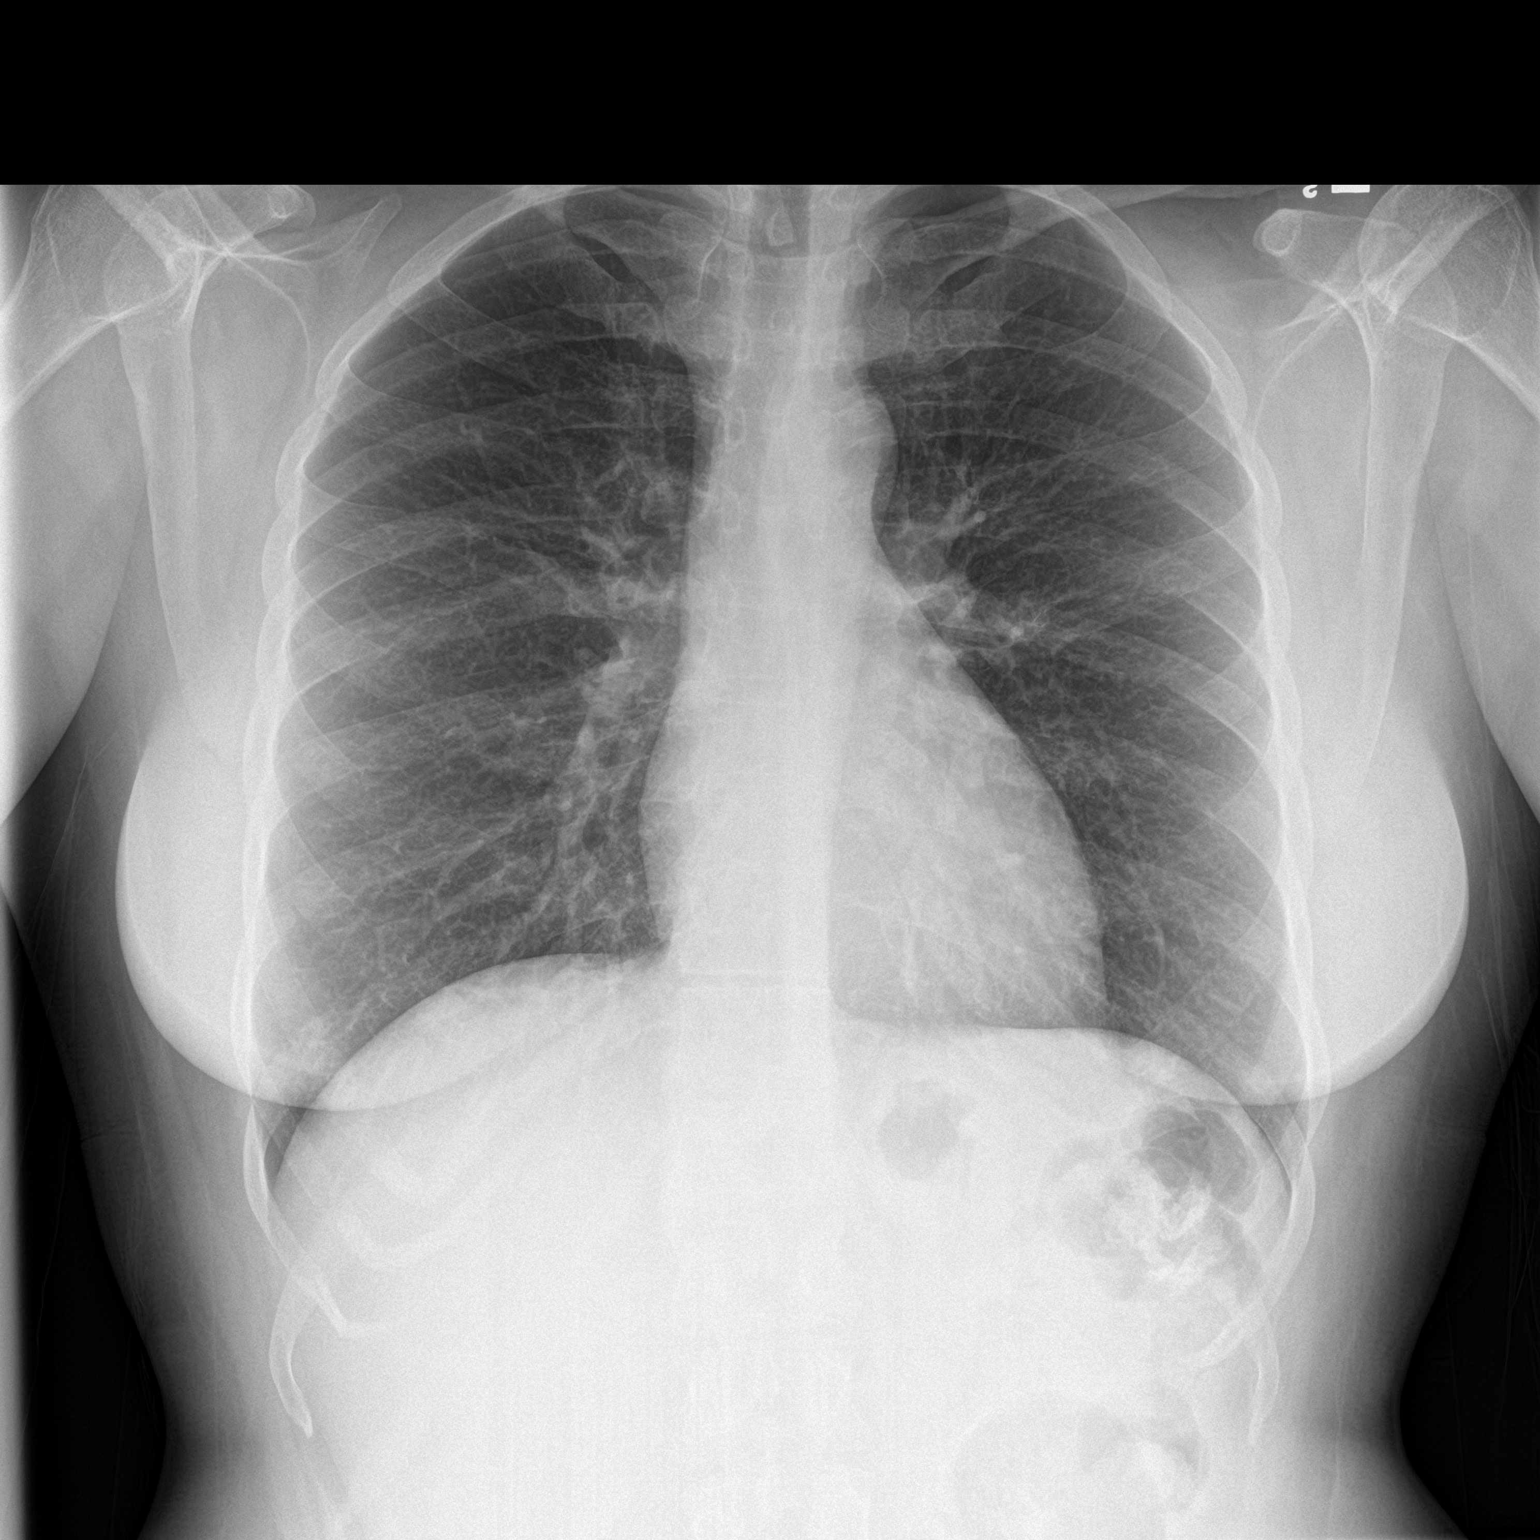
[im 2/2]
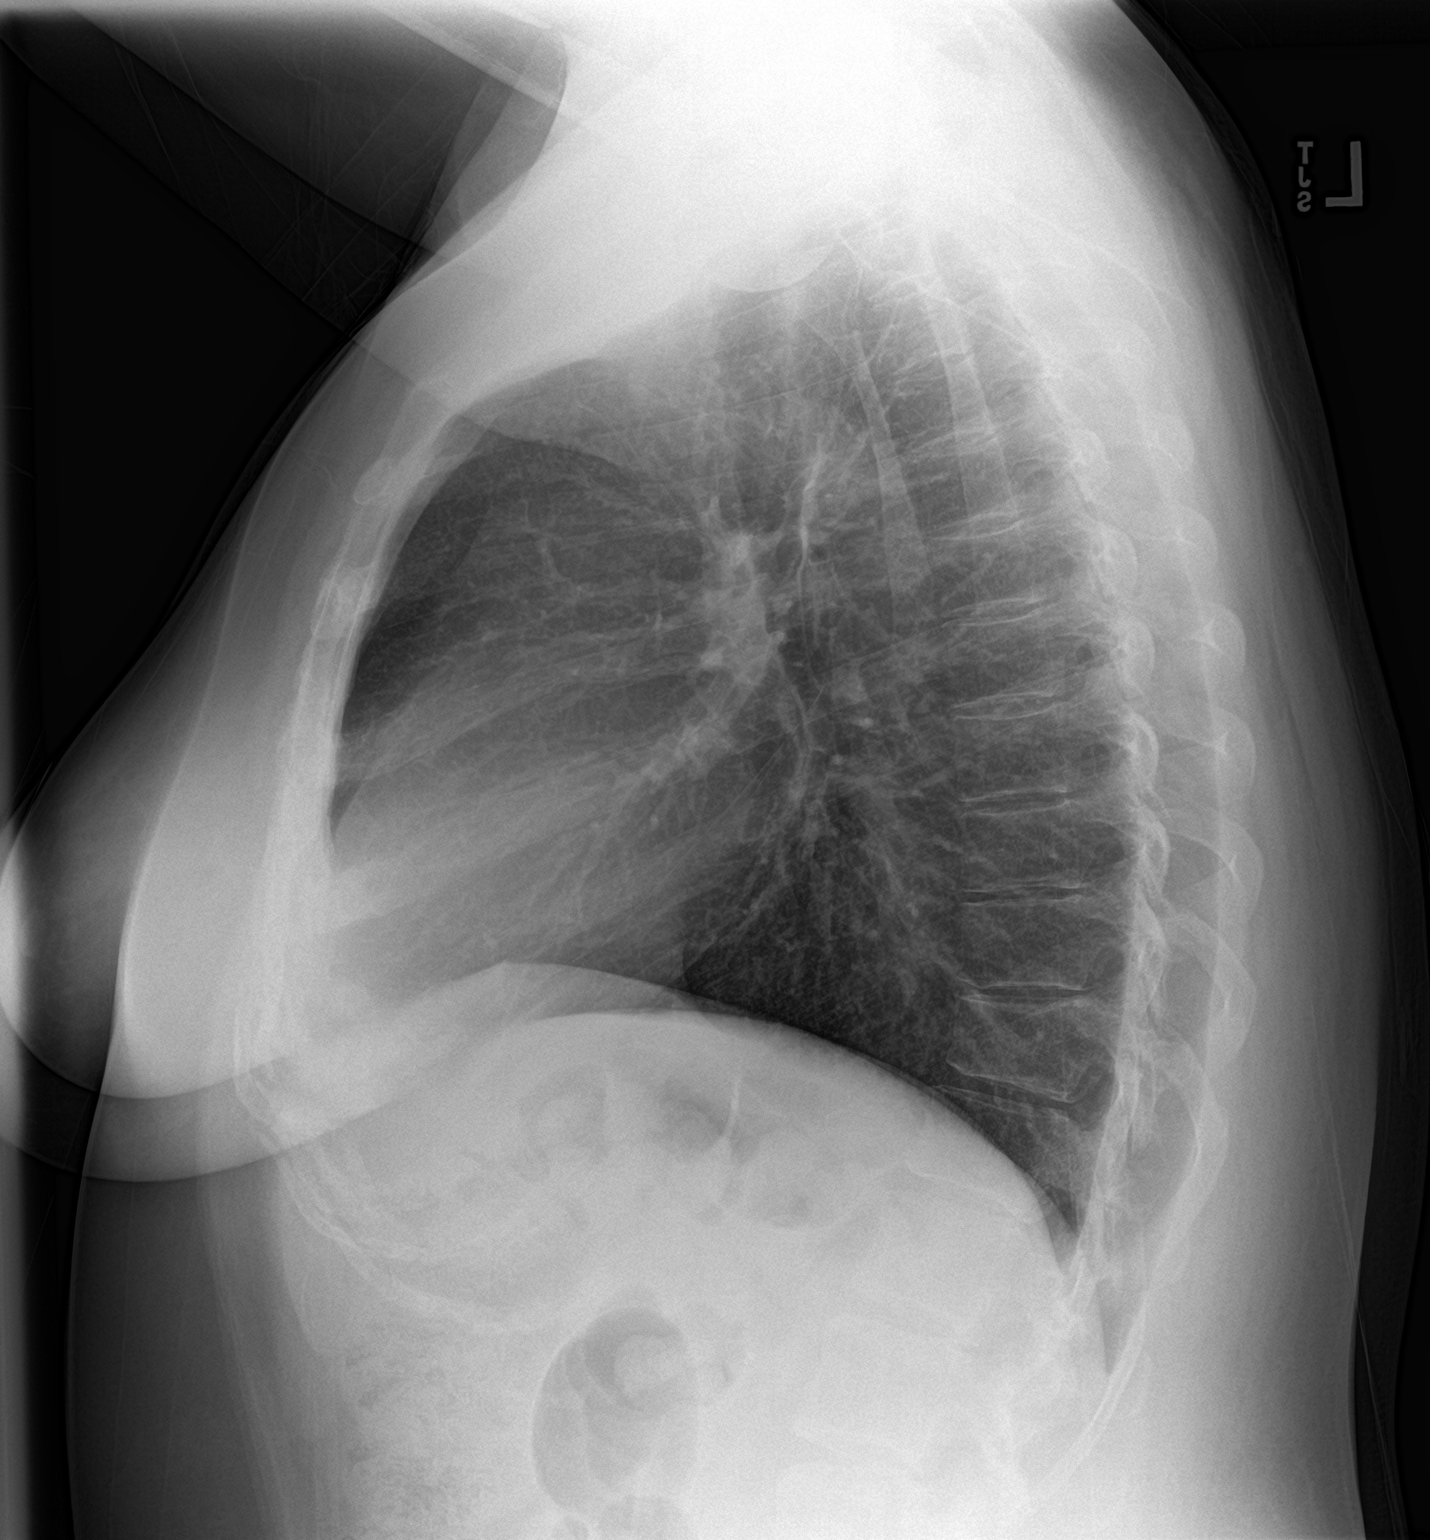

[2 of 2 positions shown; findings below may reference images not displayed]

FINDINGS: Normal heart size, mediastinal contours, and pulmonary vascularity.

Minimal peribronchial thickening.

No pulmonary infiltrate, pleural effusion, or pneumothorax.

Bones unremarkable.
IMPRESSION: Minimal bronchitic changes without infiltrate.

## 2021-03-09 NOTE — Telephone Encounter (Signed)
Spoke with patient, informed her that she should  come in for bp check and that she could be worked in today or she has a scheduled appointment with her PCP tomorrow for bp follow up. Patient stated she would keep her appointment with her PCP. drl

## 2021-03-10 ENCOUNTER — Ambulatory Visit: Payer: Self-pay

## 2021-03-10 ENCOUNTER — Ambulatory Visit: Payer: Medicaid Other | Admitting: Adult Health

## 2021-03-10 MED ORDER — AMLODIPINE BESYLATE 2.5 MG PO TABS: 2.5000 mg | ORAL_TABLET | Freq: Every day | ORAL | 0 refills | Status: AC

## 2021-03-10 NOTE — Telephone Encounter (Signed)
Copied from CRM [id number]. Topic: General - Inquiry >> Mar 10, 2021  3:24 PM Crist Infante wrote: Reason for CRM:  pt had appt today with Marcelino Duster that was cancelled due to provider out.  Pt states she has had a headache for a week, and wakes up every day with a headache.  Pt state she was put on bp medication by her OBGYN.  (labetalol ) When she called them they saw she had appt today with her pcp, so they told her to just go there. Pt states she needs appt today with someone. Can be virtual.  Pt states her bp has been elevated as well. >> Mar 10, 2021  3:36 PM Crist Infante wrote: Was finally able to get NT, as pt states her bottem number for her bp was 101 at last reading.

## 2021-03-10 NOTE — Addendum Note (Signed)
Addended by: Hyacinth Meeker on: 03/10/2021 04:50 PM   Modules accepted: Orders

## 2021-03-10 NOTE — Telephone Encounter (Signed)
Pt. Had an appointment today in the practice that was cancelled by the practice. Pt. Having headaches and BP 140/101 today. Pt. Tearful, wishes to be seen this afternoon. Spoke with Suli in the practice. Please advise.  Answer Assessment - Initial Assessment Questions 1. BLOOD PRESSURE: "What is the blood pressure?" "Did you take at least two measurements 5 minutes apart?"     140/101 2. ONSET: "When did you take your blood pressure?"     Today 3. HOW: "How did you obtain the blood pressure?" (e.g., visiting nurse, automatic home BP monitor)     Home cuff  4. HISTORY: "Do you have a history of high blood pressure?"     Yes 5. MEDICATIONS: "Are you taking any medications for blood pressure?" "Have you missed any doses recently?"     Yes 6. OTHER SYMPTOMS: "Do you have any symptoms?" (e.g., headache, chest pain, blurred vision, difficulty breathing, weakness)     Headaches 7. PREGNANCY: "Is there any chance you are pregnant?" "When was your last menstrual period?"     No  Protocols used: BLOOD PRESSURE - HIGH-A-AH

## 2021-03-10 NOTE — Telephone Encounter (Addendum)
ADD amlodipine 2.5mg  one tablet daily, #30 and reschedule follow up with Marcelino Duster 7-10 days.  She needs to go to ER if headaches are severe or if they do not improve on additional blood pressure medication.

## 2021-03-10 NOTE — Telephone Encounter (Signed)
Medication sent to [facility].

## 2021-03-10 NOTE — Telephone Encounter (Signed)
Patient advised as below. Patient verbalizes understanding and is in agreement with treatment plan.  

## 2021-03-11 ENCOUNTER — Ambulatory Visit: Payer: Self-pay

## 2021-03-14 ENCOUNTER — Telehealth: Payer: Self-pay

## 2021-03-14 NOTE — Telephone Encounter (Signed)
Copied from CRM [id number]. Topic: General - Other >> Mar 14, 2021  3:25 PM Pawlus, Maxine Glenn A wrote: Reason for CRM: Pt wanted to know the status of her medication refill for amLODipine (NORVASC) 2.5 MG tablet, stated she was just at [facility] and they said they did not have it, Please advise.

## 2021-03-15 ENCOUNTER — Ambulatory Visit (INDEPENDENT_AMBULATORY_CARE_PROVIDER_SITE_OTHER): Payer: Medicaid Other | Admitting: Adult Health

## 2021-03-15 DIAGNOSIS — Z5329 Procedure and treatment not carried out because of patient's decision for other reasons: Secondary | ICD-10-CM

## 2021-03-15 NOTE — Progress Notes (Signed)
      Established patient visit   Patient: Pamela Mooney   DOB: 06-13-1981   40 y.o. Female  MRN: [id number] Visit Date: 03/15/2021  Today's healthcare provider: Jairo Ben, FNP   No chief complaint on file.  No show for appointment.

## 2021-03-15 NOTE — Telephone Encounter (Signed)
She No showed for last appointment and discharge letter mailed on 03/04/21  will provide care per letter until 12 th chart needs to be marked as such. Please verify with her if she is getting this medication from OBGYN since she is post partum. She should be urged to find a new PCP as soon as possible.      Letter sent to patient -  March 04, 2021     Pamela Mooney [address] [place] [address]    Dear [name]:  Please be advised that your recently missed two appointments and did not show up for them.  In order for our practice to provide you with proper medical care, it is important that you keep your scheduled appointments.We have not seen you for regular scheduled follow up's as advised either.   It is our office policy that patients who have missed three (3) scheduled appointments be dismissed from our practice.  If you are unable to keep a scheduled appointment, please call at least 24 hours in advance to reschedule and avoid having this count as a missed appointment.  Thank you for your consideration  Sincerely,    Berniece Pap, FNP

## 2021-04-04 ENCOUNTER — Ambulatory Visit: Payer: Medicaid Other | Admitting: Obstetrics and Gynecology

## 2021-05-11 ENCOUNTER — Emergency Department
Admission: EM | Admit: 2021-05-11 | Discharge: 2021-05-11 | Disposition: A | Discharge status: Home / Self Care | Payer: Medicaid Other | Attending: Emergency Medicine | Admitting: Emergency Medicine

## 2021-05-11 ENCOUNTER — Other Ambulatory Visit: Payer: Self-pay

## 2021-05-11 DIAGNOSIS — M545 Low back pain, unspecified: Secondary | ICD-10-CM | POA: Insufficient documentation

## 2021-05-11 DIAGNOSIS — R531 Weakness: Secondary | ICD-10-CM | POA: Diagnosis present

## 2021-05-11 DIAGNOSIS — R200 Anesthesia of skin: Secondary | ICD-10-CM | POA: Insufficient documentation

## 2021-05-11 DIAGNOSIS — M542 Cervicalgia: Secondary | ICD-10-CM | POA: Diagnosis not present

## 2021-05-11 DIAGNOSIS — Z5321 Procedure and treatment not carried out due to patient leaving prior to being seen by health care provider: Secondary | ICD-10-CM | POA: Diagnosis not present

## 2021-05-11 LAB — COMPREHENSIVE METABOLIC PANEL
ALT: 10 U/L (ref 0–44)
AST: 23 U/L (ref 15–41)
Albumin: 4 g/dL (ref 3.5–5.0)
Alkaline Phosphatase: 42 U/L (ref 38–126)
Anion gap: 7 (ref 5–15)
BUN: 13 mg/dL (ref 6–20)
CO2: 26 mmol/L (ref 22–32)
Calcium: 8.7 mg/dL — ABNORMAL LOW (ref 8.9–10.3)
Chloride: 103 mmol/L (ref 98–111)
Creatinine, Ser: 0.91 mg/dL (ref 0.44–1.00)
GFR, Estimated: >60 mL/min (ref >60)
Glucose, Bld: 83 mg/dL (ref 70–99)
Potassium: 3.9 mmol/L (ref 3.5–5.1)
Sodium: 136 mmol/L (ref 135–145)
Total Bilirubin: 0.7 mg/dL (ref 0.3–1.2)
Total Protein: 7.6 g/dL (ref 6.5–8.1)

## 2021-05-11 LAB — URINALYSIS, COMPLETE (UACMP) WITH MICROSCOPIC
Bilirubin Urine: NEGATIVE
Glucose, UA: NEGATIVE mg/dL
Hgb urine dipstick: NEGATIVE
Ketones, ur: NEGATIVE mg/dL
Nitrite: NEGATIVE
Protein, ur: 30 mg/dL — AB
Specific Gravity, Urine: 1.024 (ref 1.005–1.030)
pH: 6 (ref 5.0–8.0)

## 2021-05-11 LAB — POC URINE PREG, ED: Preg Test, Ur: NEGATIVE

## 2021-05-11 LAB — CBC
HCT: 36.1 % (ref 36.0–46.0)
Hemoglobin: 11.4 g/dL — ABNORMAL LOW (ref 12.0–15.0)
MCH: 24.2 pg — ABNORMAL LOW (ref 26.0–34.0)
MCHC: 31.6 g/dL (ref 30.0–36.0)
MCV: 76.6 fL — ABNORMAL LOW (ref 80.0–100.0)
Platelets: 315 10*3/uL (ref 150–400)
RBC: 4.71 MIL/uL (ref 3.87–5.11)
RDW: 18.8 % — ABNORMAL HIGH (ref 11.5–15.5)
WBC: 5 10*3/uL (ref 4.0–10.5)
nRBC: 0 % (ref 0.0–0.2)

## 2021-05-11 NOTE — ED Triage Notes (Signed)
Pt in with co generalized weakness and left sided numbness for 2 days. Pt also co mid back pain states from lower back to neck. Pt denies any hx of back or neck problems.

## 2021-05-11 NOTE — ED Triage Notes (Signed)
First Nurse Note:  Arrives with c/o back pain and left sided numbness x 2 days.Pamela Mooney  AAOx3.  Skin warm and dry. NAD

## 2021-05-15 ENCOUNTER — Encounter: Payer: Self-pay | Admitting: Adult Health

## 2021-05-16 ENCOUNTER — Ambulatory Visit
Admission: RE | Admit: 2021-05-16 | Discharge: 2021-05-16 | Disposition: A | Discharge status: Home / Self Care | Payer: Medicaid Other | Source: Ambulatory Visit | Attending: Family Medicine | Admitting: Family Medicine

## 2021-05-16 ENCOUNTER — Other Ambulatory Visit: Payer: Self-pay

## 2021-05-16 ENCOUNTER — Ambulatory Visit: Payer: Self-pay

## 2021-05-16 VITALS — BP 146/99 | HR 93 | Temp 98.3°F | Resp 18 | Ht 67.0 in | Wt 155.0 lb

## 2021-05-16 DIAGNOSIS — D649 Anemia, unspecified: Secondary | ICD-10-CM

## 2021-05-16 DIAGNOSIS — M545 Low back pain, unspecified: Secondary | ICD-10-CM | POA: Diagnosis not present

## 2021-05-16 DIAGNOSIS — R5383 Other fatigue: Secondary | ICD-10-CM | POA: Diagnosis not present

## 2021-05-16 LAB — TSH: TSH: 29.686 u[IU]/mL — ABNORMAL HIGH (ref 0.350–4.500)

## 2021-05-16 MED ORDER — FERROUS SULFATE 325 (65 FE) MG PO TABS: 325.0000 mg | ORAL_TABLET | Freq: Two times a day (BID) | ORAL | 3 refills | Status: AC

## 2021-05-16 MED ORDER — MELOXICAM 15 MG PO TABS: 15.0000 mg | ORAL_TABLET | Freq: Every day | ORAL | 0 refills | Status: AC | PRN

## 2021-05-16 NOTE — Discharge Instructions (Signed)
Medications as prescribed.  Lab result will be available tomorrow.  Take care  Dr. Adriana Simas

## 2021-05-16 NOTE — ED Triage Notes (Signed)
Pt c/o left sided tingling/numbness, fatigue, back pain, and loss of appetite. She states this started about week ago. Pt is tearful during triage.

## 2021-05-16 NOTE — ED Provider Notes (Signed)
[facility]    CSN: [id number] Arrival date & time: 05/16/21  1547      History   Chief Complaint Chief Complaint  Patient presents with  . Fatigue  . Numbness  . Appointment   HPI  40 year old female presents with the above complaints.  Patient reports ongoing back pain.  This started approximately a week ago.  She went to the ER but left that being seen.  She had labs drawn.  She is concerned about her results as she noted some abnormal results via her MyChart account.  She states that she has had ongoing fatigue.  She states that she has had numbness in her left upper extremity as well as her left lower extremity.  No recent changes to her medications.  She is looking for a new primary care provider.  No relieving factors.  No other complaints.  Past Medical History:  Diagnosis Date  . Anxiety   . Depression   . Hashimoto's disease   . Hypothyroidism   . Thyroid disease     Patient Active Problem List   Diagnosis Date Noted  . No-show for appointment 03/04/2021  . IUGR (intrauterine growth restriction) affecting care of mother, third trimester, other fetus 02/22/2021  . Encounter for induction of labor 02/22/2021  . Elevated blood pressure complicating pregnancy in third trimester, antepartum 02/22/2021  . Vaginal delivery 02/22/2021  . Postpartum care and examination 02/22/2021  . Supervision of high risk pregnancy, antepartum 07/12/2020  . Substance abuse affecting pregnancy, antepartum 07/12/2020  . History of cesarean delivery, antepartum 07/12/2020  . History of IUFD 07/12/2020  . Labile blood pressure 07/12/2020  . History of placental abruption 07/12/2020  . Advanced maternal age in multigravida, unspecified trimester 07/12/2020  . Blood pressure elevated without history of HTN 04/27/2020  . Marijuana use 04/27/2020  . Hashimoto's thyroiditis 04/27/2020  . Anxiety 04/27/2020  . MDD (major depressive disorder), recurrent episode, moderate (HCC)  08/19/2019  . Hypothyroidism affecting pregnancy, antepartum 07/14/2018    Past Surgical History:  Procedure Laterality Date  . ANAL FISSURECTOMY    . CESAREAN SECTION  07/31/2005   Full term Stillborn    OB History    Gravida  11   Para  6   Term  6   Preterm      AB  5   Living  5     SAB  5   IAB      Ectopic      Multiple  0   Live Births  5            Home Medications    Prior to Admission medications   Medication Sig Start Date End Date Taking? Authorizing Provider  amLODipine (NORVASC) 2.5 MG tablet Take 1 tablet (2.5 mg total) by mouth daily. 03/10/21  Yes Malva Limes, MD  ARIPiprazole (ABILIFY) 2 MG tablet Take 2 mg by mouth daily.   Yes [provider]  buPROPion (WELLBUTRIN XL) 300 MG 24 hr tablet Take 300 mg by mouth every morning. 07/06/20  Yes [provider]  clonazePAM (KLONOPIN) 1 MG tablet SMARTSIG:1-2.5 Tablet(s) By Mouth Daily PRN 07/06/20  Yes [provider]  ferrous sulfate 325 (65 FE) MG tablet Take 1 tablet (325 mg total) by mouth in the morning and at bedtime. 05/16/21  Yes Rita Vialpando G, DO  labetalol (NORMODYNE) 200 MG tablet Take 1 tablet (200 mg total) by mouth 2 (two) times daily. 02/24/21 05/25/21 Yes Mirna Mires,  CNM  levothyroxine (SYNTHROID) 175 MCG tablet Take 1 tablet (175 mcg total) by mouth daily before breakfast. 02/08/21  Yes Vena Austria, MD  meloxicam (MOBIC) 15 MG tablet Take 1 tablet (15 mg total) by mouth daily as needed for pain. 05/16/21  Yes Markelle Najarian G, DO  Blood Pressure Monitoring (BLOOD PRESSURE MONITOR/L CUFF) MISC 1 Device by Does not apply route once as needed for up to 1 dose. Monitor blood pressure brachial ( upper arm) once daily and keep log. 05/06/20   Flinchum, Eula Fried, FNP  Misc. Devices (PULSE OXIMETER) MISC 1 Device by Does not apply route daily as needed. Monitor heart rate and oxygen saturation once daily and keep log. Leave on finger for one minute before  recording. 05/06/20   Flinchum, Eula Fried, FNP  traZODone (DESYREL) 100 MG tablet Take 100 mg by mouth at bedtime. Patient not taking: No sig reported 11/16/20 05/16/21  [provider]    Family History History reviewed. No pertinent family history.  Social History Social History   Tobacco Use  . Smoking status: Never Smoker  . Smokeless tobacco: Never Used  Vaping Use  . Vaping Use: Every day  Substance Use Topics  . Alcohol use: Not Currently  . Drug use: Yes    Types: Marijuana    Comment: last usage 01/09/2021     Allergies   Patient has no known allergies.   Review of Systems Review of Systems Per HPI  Physical Exam Triage Vital Signs ED Triage Vitals  Enc Vitals Group     BP 05/16/21 1618 (!) 146/99     Pulse Rate 05/16/21 1618 93     Resp 05/16/21 1618 18     Temp 05/16/21 1618 98.3 F (36.8 C)     Temp Source 05/16/21 1618 Oral     SpO2 05/16/21 1618 100 %     Weight 05/16/21 1615 154 lb 15.7 oz (70.3 kg)     Height 05/16/21 1615 5\' 7"  (1.702 m)     Head Circumference --      Peak Flow --      Pain Score 05/16/21 1615 7     Pain Loc --      Pain Edu? --      Excl. in GC? --    Updated Vital Signs BP (!) 146/99 (BP Location: Left Arm)   Pulse 93   Temp 98.3 F (36.8 C) (Oral)   Resp 18   Ht 5\' 7"  (1.702 m)   Wt 70.3 kg   LMP 04/28/2021   SpO2 100%   Breastfeeding No   BMI 24.27 kg/m   Visual Acuity Right Eye Distance:   Left Eye Distance:   Bilateral Distance:    Right Eye Near:   Left Eye Near:    Bilateral Near:     Physical Exam Vitals and nursing note reviewed.  Constitutional:      General: She is not in acute distress. HENT:     Head: Normocephalic and atraumatic.  Eyes:     General:        Right eye: No discharge.        Left eye: No discharge.     Conjunctiva/sclera: Conjunctivae normal.  Cardiovascular:     Rate and Rhythm: Normal rate and regular rhythm.     Heart sounds: No murmur heard.   Pulmonary:      Effort: Pulmonary effort is normal.     Breath sounds: Normal breath sounds. No wheezing, rhonchi  or rales.  Neurological:     Mental Status: She is alert.  Psychiatric:     Comments: Tearful.    UC Treatments / Results  Labs (all labs ordered are listed, but only abnormal results are displayed) Labs Reviewed  TSH    EKG   Radiology No results found.  Procedures Procedures (including critical care time)  Medications Ordered in UC Medications - No data to display  Initial Impression / Assessment and Plan / UC Course  I have reviewed the triage vital signs and the nursing notes.  Pertinent labs & imaging results that were available during my care of the patient were reviewed by me and considered in my medical decision making (see chart for details).    40 year old female presents with back pain and fatigue.  Recent laboratory studies reviewed with the patient.  She is mildly anemic.  Suspect iron deficiency anemia.  Placing on ferrous sulfate.  Back pain appears to be musculoskeletal in origin.  Meloxicam as needed.  Awaiting TSH results.  Final Clinical Impressions(s) / UC Diagnoses   Final diagnoses:  Acute low back pain, unspecified back pain laterality, unspecified whether sciatica present  Fatigue, unspecified type  Anemia, unspecified type     Discharge Instructions     Medications as prescribed.  Lab result will be available tomorrow.  Take care  Dr. Adriana Simas    ED Prescriptions    Medication Sig Dispense Auth. Provider   ferrous sulfate 325 (65 FE) MG tablet Take 1 tablet (325 mg total) by mouth in the morning and at bedtime. 60 tablet Bryssa Tones G, DO   meloxicam (MOBIC) 15 MG tablet Take 1 tablet (15 mg total) by mouth daily as needed for pain. 30 tablet Tommie Sams, DO     PDMP not reviewed this encounter.   Tommie Sams, [place] 05/16/21 [phone]

## 2021-05-17 ENCOUNTER — Encounter: Payer: Self-pay | Admitting: Adult Health

## 2021-05-18 ENCOUNTER — Telehealth: Payer: Self-pay | Admitting: Family Medicine

## 2021-05-18 MED ORDER — LEVOTHYROXINE SODIUM 200 MCG PO TABS: 200.0000 ug | ORAL_TABLET | Freq: Every day | ORAL | 0 refills | Status: AC

## 2021-05-18 NOTE — Telephone Encounter (Signed)
TSH markedly elevated.  Increasing Synthroid.  Everlene Other DO [facility]

## 2021-06-02 IMAGING — US US OB COMP LESS 14 WK
1 series · 14 of 28 positions shown · non-contrast
Comparison: None.

CLINICAL DATA: Vaginal bleeding

EXAM:
OBSTETRIC <14 WK ULTRASOUND
TECHNIQUE: Transabdominal ultrasound was performed for evaluation of the
gestation as well as the maternal uterus and adnexal regions.

[Series 1: us ob comp less 14 wks · 14 of 45 slices shown]
[im 2/45]
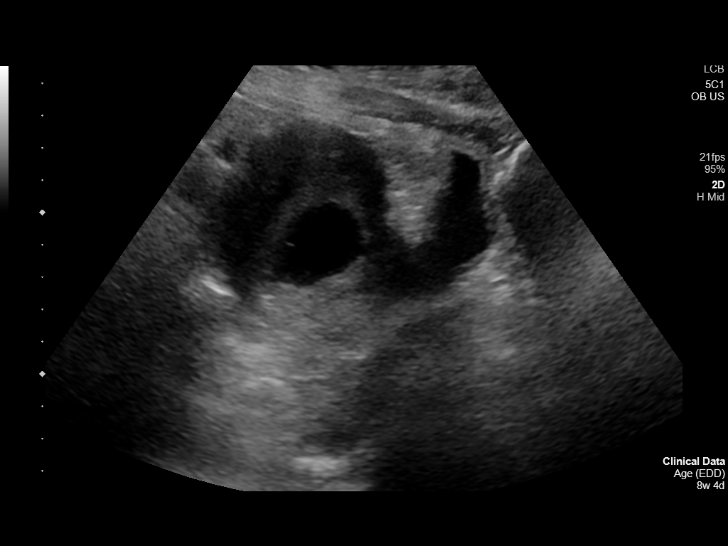
[im 5/45]
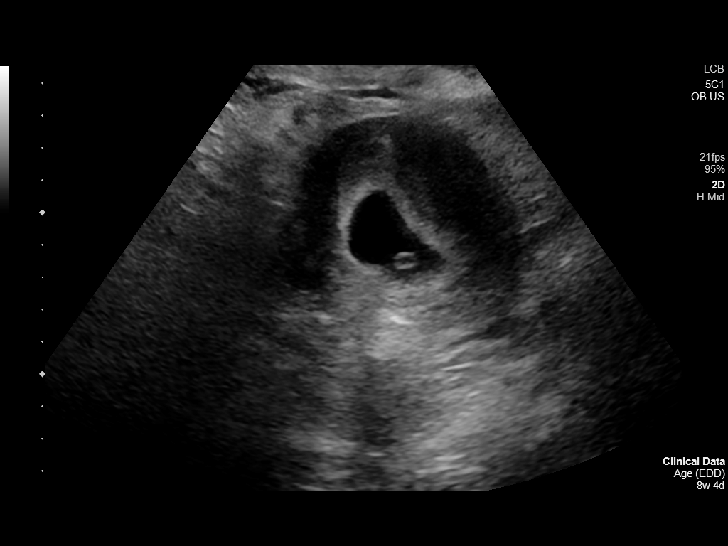
[im 9/45]
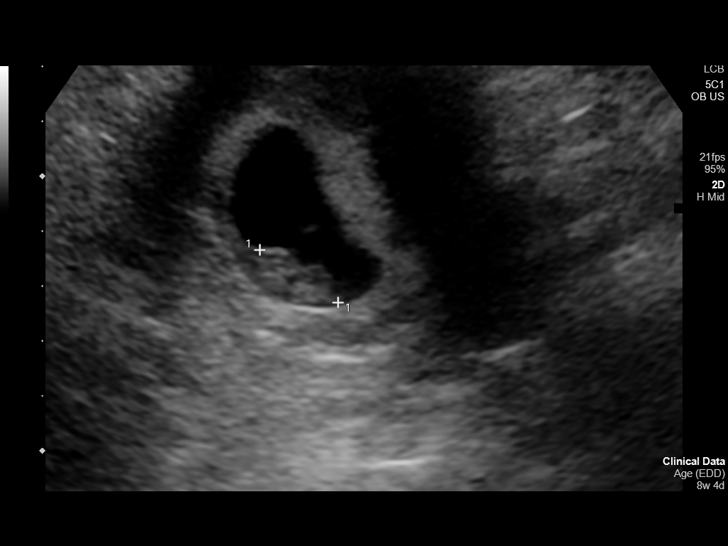
[im 12/45]
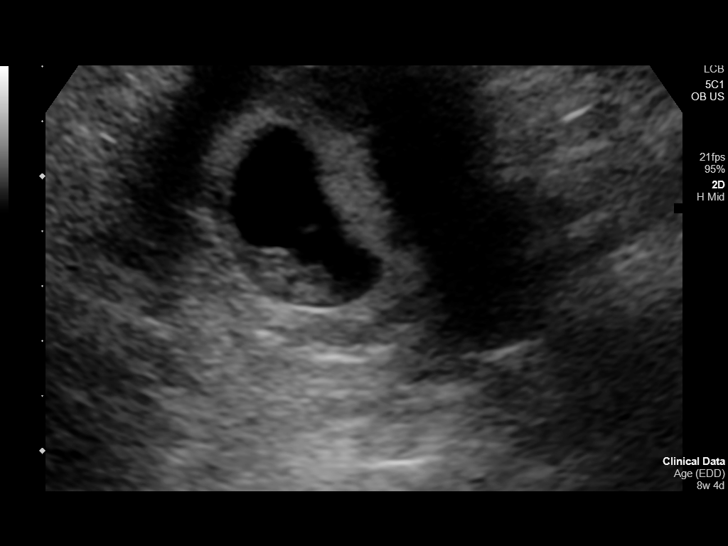
[im 15/45]
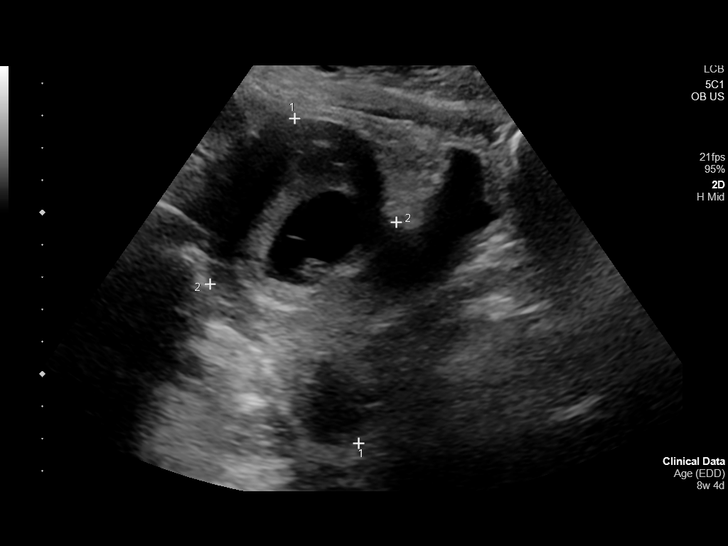
[im 18/45]
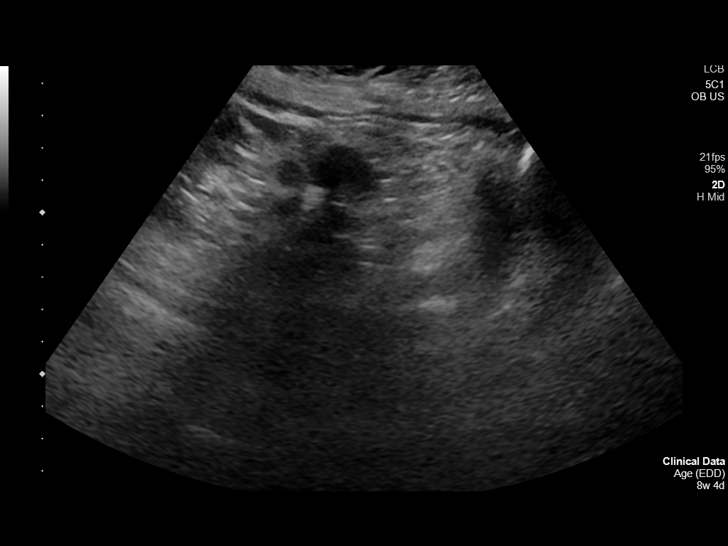
[im 22/45]
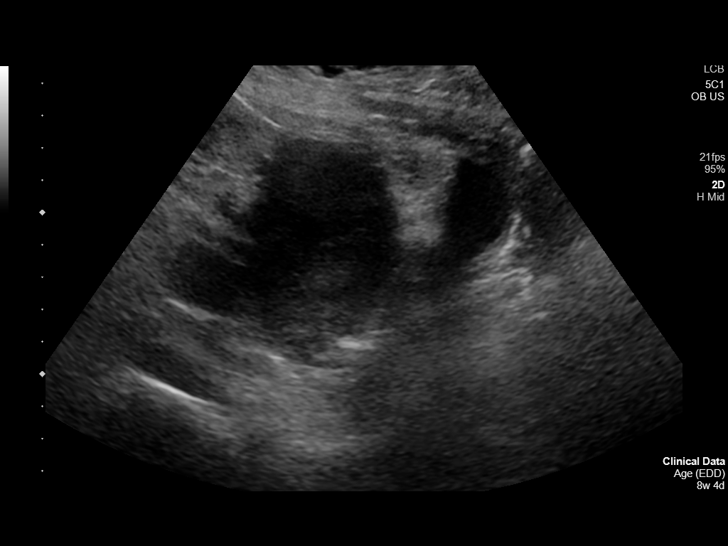
[im 25/45]
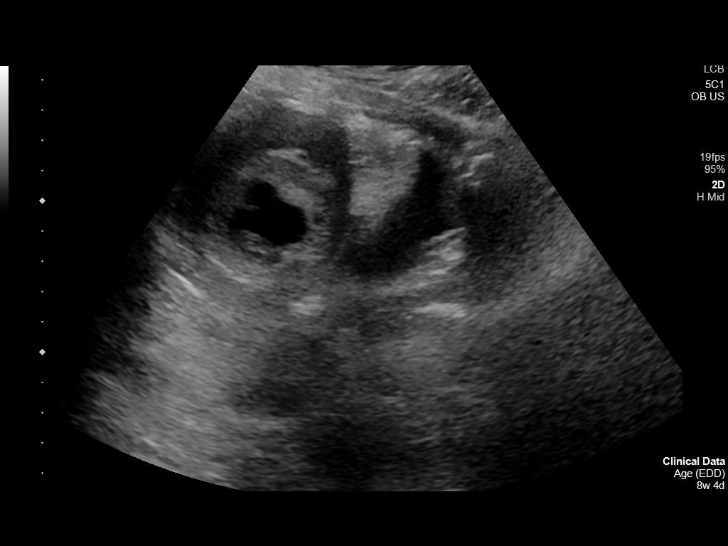
[im 28/45]
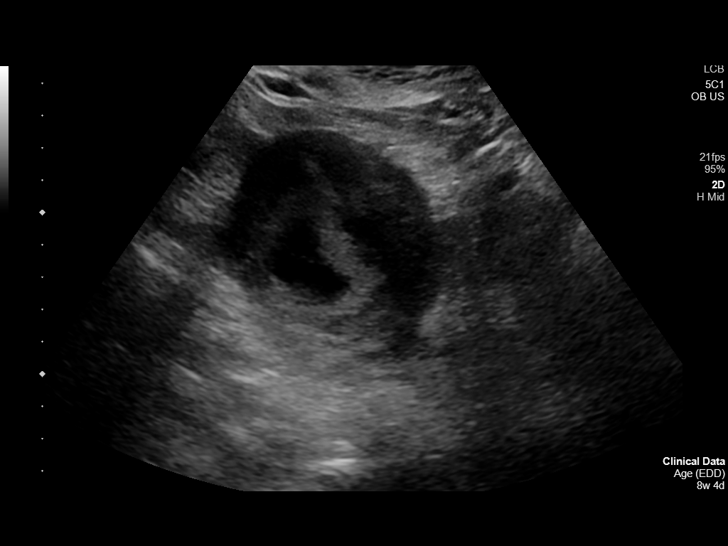
[im 31/45]
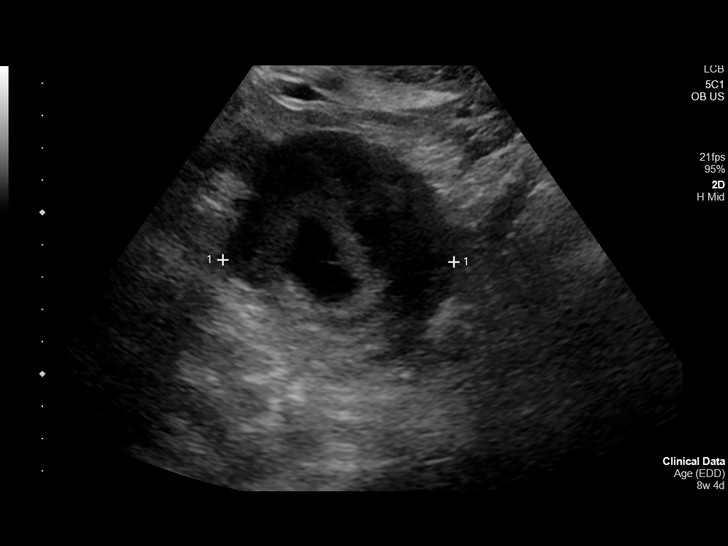
[im 35/45]
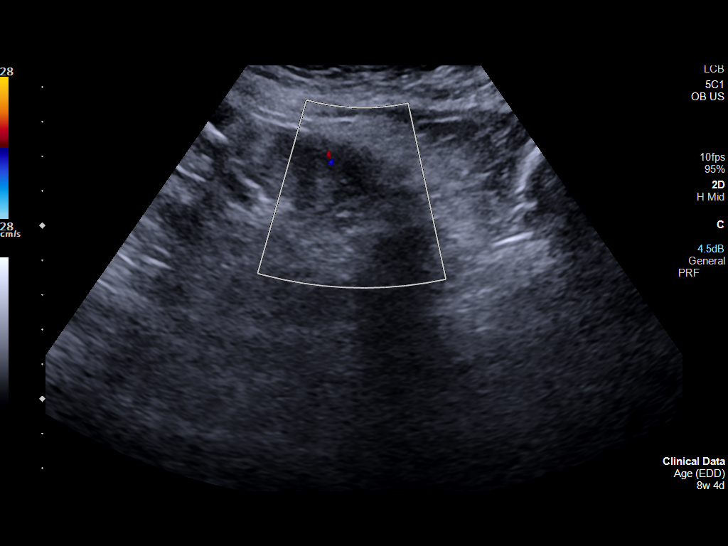
[im 38/45]
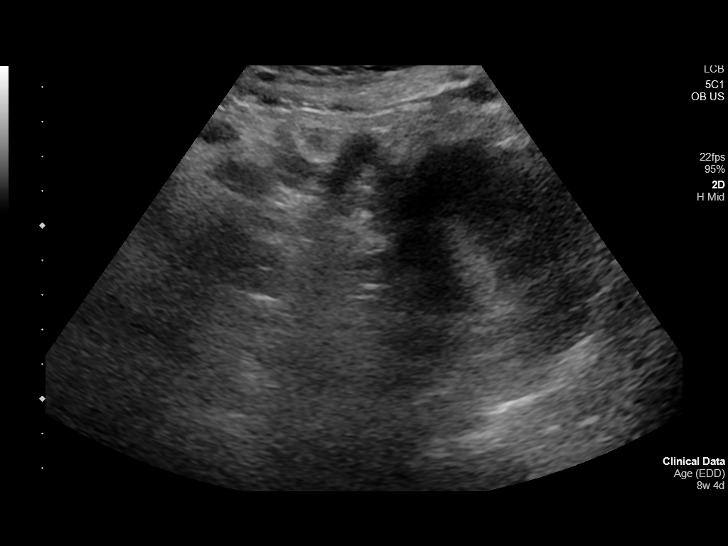
[im 41/45]
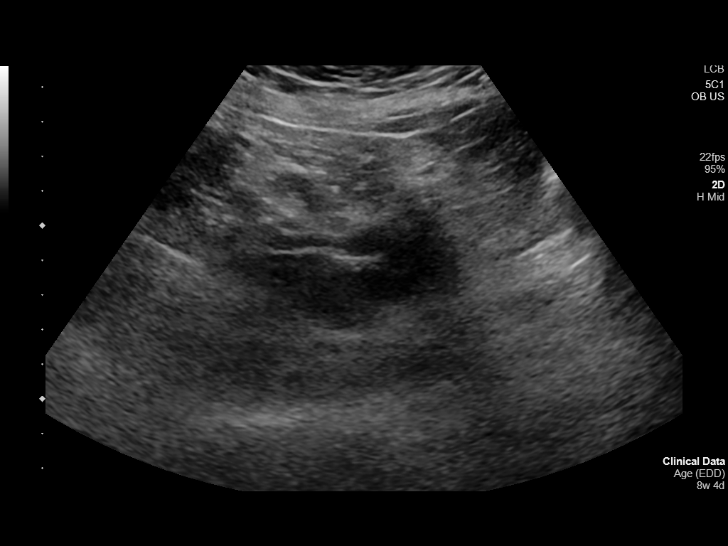
[im 45/45]
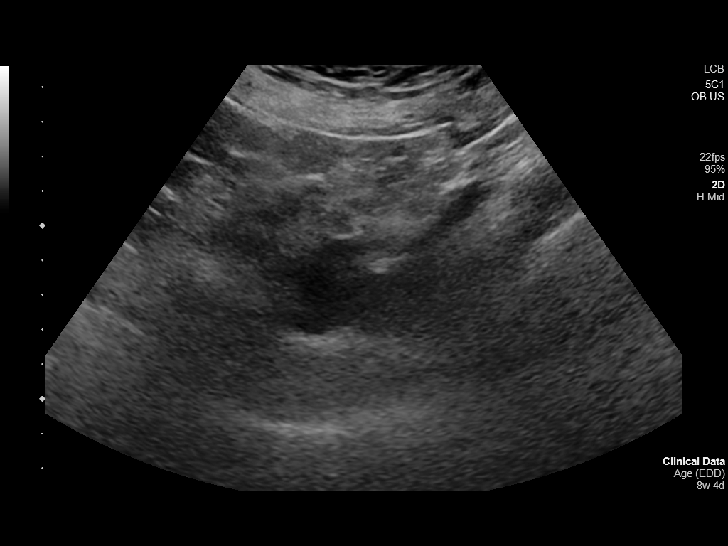

[14 of 28 positions shown; findings below may reference images not displayed]

FINDINGS: Intrauterine gestational sac: Single

Yolk sac:  Visualized.

Embryo:  Visualized.

Cardiac Activity: Visualized.

Heart Rate: 178 bpm

MSD:    mm    w     d

CRL:   17.1 mm   8 w 1 d                  US EDC: 03/10/2021

Subchorionic hemorrhage:  None visualized.

Maternal uterus/adnexae: No adnexal mass or free fluid.
IMPRESSION: Eight week 1 day intrauterine pregnancy. Fetal heart rate 178 beats
per minute. No acute maternal findings.

## 2021-06-05 NOTE — Telephone Encounter (Signed)
Patient aware he needs to seek care with another provider. This Provider no longer at [facility] family practice and she needs to establish care with another provider.

## 2021-09-04 ENCOUNTER — Ambulatory Visit: Payer: Self-pay

## 2021-12-11 LAB — FETAL NONSTRESS TEST

## 2023-01-07 DIAGNOSIS — F332 Major depressive disorder, recurrent severe without psychotic features: Secondary | ICD-10-CM | POA: Diagnosis not present

## 2023-01-07 DIAGNOSIS — F410 Panic disorder [episodic paroxysmal anxiety] without agoraphobia: Secondary | ICD-10-CM | POA: Diagnosis not present

## 2023-01-07 DIAGNOSIS — F411 Generalized anxiety disorder: Secondary | ICD-10-CM | POA: Diagnosis not present

## 2023-06-05 DIAGNOSIS — Z79899 Other long term (current) drug therapy: Secondary | ICD-10-CM | POA: Diagnosis not present

## 2023-06-06 DIAGNOSIS — F332 Major depressive disorder, recurrent severe without psychotic features: Secondary | ICD-10-CM | POA: Diagnosis not present

## 2023-06-06 DIAGNOSIS — F410 Panic disorder [episodic paroxysmal anxiety] without agoraphobia: Secondary | ICD-10-CM | POA: Diagnosis not present

## 2023-06-06 DIAGNOSIS — F411 Generalized anxiety disorder: Secondary | ICD-10-CM | POA: Diagnosis not present

## 2023-07-05 DIAGNOSIS — F410 Panic disorder [episodic paroxysmal anxiety] without agoraphobia: Secondary | ICD-10-CM | POA: Diagnosis not present

## 2023-07-05 DIAGNOSIS — F411 Generalized anxiety disorder: Secondary | ICD-10-CM | POA: Diagnosis not present

## 2023-07-05 DIAGNOSIS — F1410 Cocaine abuse, uncomplicated: Secondary | ICD-10-CM | POA: Diagnosis not present

## 2023-08-07 DIAGNOSIS — F411 Generalized anxiety disorder: Secondary | ICD-10-CM | POA: Diagnosis not present

## 2023-08-07 DIAGNOSIS — F410 Panic disorder [episodic paroxysmal anxiety] without agoraphobia: Secondary | ICD-10-CM | POA: Diagnosis not present

## 2023-08-07 DIAGNOSIS — F1410 Cocaine abuse, uncomplicated: Secondary | ICD-10-CM | POA: Diagnosis not present

## 2023-09-04 DIAGNOSIS — F411 Generalized anxiety disorder: Secondary | ICD-10-CM | POA: Diagnosis not present

## 2023-09-04 DIAGNOSIS — F410 Panic disorder [episodic paroxysmal anxiety] without agoraphobia: Secondary | ICD-10-CM | POA: Diagnosis not present

## 2023-09-04 DIAGNOSIS — F332 Major depressive disorder, recurrent severe without psychotic features: Secondary | ICD-10-CM | POA: Diagnosis not present

## 2024-01-21 DIAGNOSIS — R030 Elevated blood-pressure reading, without diagnosis of hypertension: Secondary | ICD-10-CM | POA: Diagnosis not present

## 2024-01-21 DIAGNOSIS — E063 Autoimmune thyroiditis: Secondary | ICD-10-CM | POA: Diagnosis not present

## 2024-01-21 DIAGNOSIS — R5383 Other fatigue: Secondary | ICD-10-CM | POA: Diagnosis not present

## 2024-01-21 DIAGNOSIS — J111 Influenza due to unidentified influenza virus with other respiratory manifestations: Secondary | ICD-10-CM | POA: Diagnosis not present

## 2024-01-21 DIAGNOSIS — R5381 Other malaise: Secondary | ICD-10-CM | POA: Diagnosis not present

## 2024-01-21 DIAGNOSIS — R110 Nausea: Secondary | ICD-10-CM | POA: Diagnosis not present

## 2024-01-21 DIAGNOSIS — Z20822 Contact with and (suspected) exposure to covid-19: Secondary | ICD-10-CM | POA: Diagnosis not present

## 2024-12-01 ENCOUNTER — Ambulatory Visit
Admission: RE | Admit: 2024-12-01 | Discharge: 2024-12-01 | Disposition: A | Discharge status: Home / Self Care | Payer: MEDICAID | Attending: Family Medicine

## 2024-12-01 ENCOUNTER — Ambulatory Visit: Payer: Self-pay

## 2024-12-01 VITALS — BP 163/108 | HR 82 | Temp 97.8°F | Resp 16

## 2024-12-01 DIAGNOSIS — S39012A Strain of muscle, fascia and tendon of lower back, initial encounter: Secondary | ICD-10-CM

## 2024-12-01 MED ORDER — PREDNISONE 20 MG PO TABS: 40.0000 mg | ORAL_TABLET | Freq: Every day | ORAL | 0 refills | Status: AC

## 2024-12-01 MED ORDER — TIZANIDINE HCL 4 MG PO TABS: 4.0000 mg | ORAL_TABLET | Freq: Three times a day (TID) | ORAL | 0 refills | Status: AC | PRN

## 2024-12-01 NOTE — Discharge Instructions (Signed)
 Discontinue your ibuprofen  until you complete your prednisone  course.  Take the muscle relaxer as needed with caution as it may cause drowsiness.  Heat, massage, stretches, rest.

## 2024-12-01 NOTE — Telephone Encounter (Signed)
 FYI Only or Action Required?: FYI only for provider: appointment scheduled on 12/9 at [facility].  Patient was last seen in primary care on.  Called Nurse Triage reporting Back Pain.  Symptoms began several weeks ago.  Interventions attempted: OTC medications: ibuprofen  and Prescription medications: gabapentin.  Symptoms are: gradually worsening.  Triage Disposition: See HCP Within 4 Hours (Or PCP Triage)  Patient/caregiver understands and will follow disposition?: Yes  Copied from CRM [id number]. Topic: Clinical - Red Word Triage >> Dec 01, 2024 11:28 AM Delon DASEN wrote: Red Word that prompted transfer to Nurse Triage: back pain level 8 Reason for Disposition  [1] Pain radiates into the thigh or further down the leg AND [2] both legs  Answer Assessment - Initial Assessment Questions Low back pain, referred down to her legs- pain/tightness- like they wont relax. Skin tight with tension  Hard to stand and walk for long periods of time Pain worsened over the last few weeks. Denies numbness, tingling, incontinence, fever, abdominal pain.   New pt appt in Feb placed on the waitlist. Advised UC in the interim. Appt made for UC this afternoon so she can arrange transport. ED precautions understood.  1. ONSET: When did the pain begin? (e.g., minutes, hours, days)     Worse over the last few weeks  2. LOCATION: Where does it hurt? (upper, mid or lower back)     Low back  3. SEVERITY: How bad is the pain?  (e.g., Scale 1-10; mild, moderate, or severe)     7-8/10- 600mg  ibuprofen  and gabapentin- not helping  4. PATTERN: Is the pain constant? (e.g., yes, no; constant, intermittent)      Constant but worsens with prolonged sitting or standing  5. RADIATION: Does the pain shoot into your legs or somewhere else?     Down her legs  6. CAUSE:  What do you think is causing the back pain?      Unsure  7. BACK OVERUSE:  Any recent lifting of heavy objects, strenuous work or exercise?      denies 8. MEDICINES: What have you taken so far for the pain? (e.g., nothing, acetaminophen , NSAIDS)     Ibuprofen  and gabapentin  9. NEUROLOGIC SYMPTOMS: Do you have any weakness, numbness, or problems with bowel/bladder control?     Generalized weakness of LE, stiffness has caused a fall 10. OTHER SYMPTOMS: Do you have any other symptoms? (e.g., fever, abdomen pain, burning with urination, blood in urine)       Stiffness, gen weakness in LE  11. PREGNANCY: Is there any chance you are pregnant? When was your last menstrual period?       denies  Protocols used: Back Pain-A-AH

## 2024-12-01 NOTE — ED Provider Notes (Signed)
 [facility]    CSN: [id number] Arrival date & time: 12/01/24  1516      History   Chief Complaint Chief Complaint  Patient presents with   Back Pain    Entered by patient    HPI Pamela Mooney is a 43 y.o. female.   Patient presenting today with 2-week history of acute on chronic low back pain that has been worsening since onset.  Denies radiation of pain down legs, weakness numbness or tingling, bowel or bladder incontinence, saddle anesthesias, fevers, urinary symptoms, abdominal pain, nausea vomiting or diarrhea.  Notes this is an ongoing issue for her.  No known injury prior to onset.  Takes gabapentin and ibuprofen  for chronic pain daily.    Past Medical History:  Diagnosis Date   Anxiety    Depression    Hashimoto's disease    Hypothyroidism    Thyroid  disease     Patient Active Problem List   Diagnosis Date Noted   No-show for appointment 03/04/2021   IUGR (intrauterine growth restriction) affecting care of mother, third trimester, other fetus 02/22/2021   Encounter for induction of labor 02/22/2021   Elevated blood pressure complicating pregnancy in third trimester, antepartum 02/22/2021   Vaginal delivery 02/22/2021   Postpartum care and examination 02/22/2021   Supervision of high risk pregnancy, antepartum 07/12/2020   Substance abuse affecting pregnancy, antepartum (HCC) 07/12/2020   History of cesarean delivery, antepartum 07/12/2020   History of IUFD 07/12/2020   Labile blood pressure 07/12/2020   History of placental abruption 07/12/2020   Advanced maternal age in multigravida, unspecified trimester 07/12/2020   Blood pressure elevated without history of HTN 04/27/2020   Marijuana use 04/27/2020   Hashimoto's thyroiditis 04/27/2020   Anxiety 04/27/2020   MDD (major depressive disorder), recurrent episode, moderate (HCC) 08/19/2019   Hypothyroidism affecting pregnancy, antepartum 07/14/2018    Past Surgical History:  Procedure  Laterality Date   ANAL FISSURECTOMY     CESAREAN SECTION  07/31/2005   Full term Stillborn    OB History     Gravida  11   Para  6   Term  6   Preterm      AB  5   Living  5      SAB  5   IAB      Ectopic      Multiple  0   Live Births  5            Home Medications    Prior to Admission medications   Medication Sig Start Date End Date Taking? Authorizing Provider  predniSONE  (DELTASONE ) 20 MG tablet Take 2 tablets (40 mg total) by mouth daily with breakfast. 12/01/24  Yes Stuart Vernell Norris, PA-C  tiZANidine  (ZANAFLEX ) 4 MG tablet Take 1 tablet (4 mg total) by mouth every 8 (eight) hours as needed for muscle spasms. Do not drink alcohol or drive while taking this medication.  May cause drowsiness. 12/01/24  Yes Stuart Vernell Norris, PA-C  amLODipine  (NORVASC ) 2.5 MG tablet Take 1 tablet (2.5 mg total) by mouth daily. 03/10/21   Gasper Nancyann BRAVO, MD  ARIPiprazole  (ABILIFY ) 2 MG tablet Take 2 mg by mouth daily.    [provider]  Blood Pressure Monitoring (BLOOD PRESSURE MONITOR/L CUFF) MISC 1 Device by Does not apply route once as needed for up to 1 dose. Monitor blood pressure brachial ( upper arm) once daily and keep log. 05/06/20   Flinchum, Rosaline RAMAN, FNP  buPROPion  (WELLBUTRIN   XL) 300 MG 24 hr tablet Take 300 mg by mouth every morning. 07/06/20   [provider]  clonazePAM (KLONOPIN) 1 MG tablet SMARTSIG:1-2.5 Tablet(s) By Mouth Daily PRN 07/06/20   [provider]  ferrous sulfate  325 (65 FE) MG tablet Take 1 tablet (325 mg total) by mouth in the morning and at bedtime. 05/16/21   Cook, Jayce G, DO  labetalol  (NORMODYNE ) 200 MG tablet Take 1 tablet (200 mg total) by mouth 2 (two) times daily. 02/24/21 05/25/21  Carlin Rollene HERO, CNM  levothyroxine  (SYNTHROID ) 175 MCG tablet Take 1 tablet (175 mcg total) by mouth daily before breakfast. 02/08/21   Lake Read, MD  levothyroxine  (SYNTHROID ) 200 MCG tablet Take 1 tablet (200 mcg  total) by mouth daily before breakfast. 05/18/21   Cook, Jayce G, DO  meloxicam  (MOBIC ) 15 MG tablet Take 1 tablet (15 mg total) by mouth daily as needed for pain. 05/16/21   Cook, Jayce G, DO  Misc. Devices (PULSE OXIMETER) MISC 1 Device by Does not apply route daily as needed. Monitor heart rate and oxygen saturation once daily and keep log. Leave on finger for one minute before recording. 05/06/20   Flinchum, Rosaline RAMAN, FNP  traZODone (DESYREL) 100 MG tablet Take 100 mg by mouth at bedtime. Patient not taking: No sig reported 11/16/20 05/16/21  [provider]    Family History History reviewed. No pertinent family history.  Social History Social History   Tobacco Use   Smoking status: Never   Smokeless tobacco: Never  Vaping Use   Vaping status: Every Day  Substance Use Topics   Alcohol use: Not Currently   Drug use: Yes    Types: Marijuana    Comment: last usage 01/09/2021     Allergies   Patient has no known allergies.   Review of Systems Review of Systems Per HPI  Physical Exam Triage Vital Signs ED Triage Vitals  Encounter Vitals Group     BP 12/01/24 1605 (!) 163/108     Girls Systolic BP Percentile --      Girls Diastolic BP Percentile --      Boys Systolic BP Percentile --      Boys Diastolic BP Percentile --      Pulse Rate 12/01/24 1605 82     Resp 12/01/24 1605 16     Temp 12/01/24 1605 97.8 F (36.6 C)     Temp Source 12/01/24 1605 Oral     SpO2 12/01/24 1605 97 %     Weight --      Height --      Head Circumference --      Peak Flow --      Pain Score 12/01/24 1603 8     Pain Loc --      Pain Education --      Exclude from Growth Chart --    No data found.  Updated Vital Signs BP (!) 163/108 (BP Location: Right Arm)   Pulse 82   Temp 97.8 F (36.6 C) (Oral)   Resp 16   LMP 11/15/2024 (Approximate)   SpO2 97%   Visual Acuity Right Eye Distance:   Left Eye Distance:   Bilateral Distance:    Right Eye Near:   Left Eye Near:     Bilateral Near:     Physical Exam Vitals and nursing note reviewed.  Constitutional:      Appearance: Normal appearance. She is not ill-appearing.  HENT:     Head: Atraumatic.  Mouth/Throat:     Mouth: Mucous membranes are moist.  Eyes:     Extraocular Movements: Extraocular movements intact.     Conjunctiva/sclera: Conjunctivae normal.  Cardiovascular:     Rate and Rhythm: Normal rate.  Pulmonary:     Effort: Pulmonary effort is normal.  Musculoskeletal:        General: Tenderness present. No swelling, deformity or signs of injury. Normal range of motion.     Cervical back: Normal range of motion and neck supple.     Comments: Bilateral lumbar musculature tender to palpation, no midline spinal tenderness to palpation diffusely.  Negative straight leg raise bilateral lower extremities.  Normal gait and range of motion  Skin:    General: Skin is warm and dry.     Findings: No bruising or erythema.  Neurological:     Mental Status: She is alert and oriented to person, place, and time.     Motor: No weakness.     Gait: Gait normal.     Comments: Bilateral lower extremities neurovascularly intact  Psychiatric:        Mood and Affect: Mood normal.        Thought Content: Thought content normal.        Judgment: Judgment normal.      UC Treatments / Results  Labs (all labs ordered are listed, but only abnormal results are displayed) Labs Reviewed - No data to display  EKG   Radiology No results found.  Procedures Procedures (including critical care time)  Medications Ordered in UC Medications - No data to display  Initial Impression / Assessment and Plan / UC Course  I have reviewed the triage vital signs and the nursing notes.  Pertinent labs & imaging results that were available during my care of the patient were reviewed by me and considered in my medical decision making (see chart for details).     Hypertensive in triage, otherwise vital signs  reassuring.  Exam without red flag findings or concerning features today.  Consistent with lumbar strain.  Treat with prednisone , Zanaflex , heat, soft, stretches, rest.  Discontinue ibuprofen  while on the prednisone .  Return for worsening or unresolving symptoms.  Final Clinical Impressions(s) / UC Diagnoses   Final diagnoses:  Strain of lumbar region, initial encounter     Discharge Instructions      Discontinue your ibuprofen  until you complete your prednisone  course.  Take the muscle relaxer as needed with caution as it may cause drowsiness.  Heat, massage, stretches, rest.    ED Prescriptions     Medication Sig Dispense Auth. Provider   predniSONE  (DELTASONE ) 20 MG tablet Take 2 tablets (40 mg total) by mouth daily with breakfast. 10 tablet Stuart Vernell Norris, PA-C   tiZANidine  (ZANAFLEX ) 4 MG tablet Take 1 tablet (4 mg total) by mouth every 8 (eight) hours as needed for muscle spasms. Do not drink alcohol or drive while taking this medication.  May cause drowsiness. 15 tablet Stuart Vernell Norris, [place]      PDMP not reviewed this encounter.   Stuart Vernell Norris, PA-C 12/01/24 1642

## 2024-12-01 NOTE — ED Triage Notes (Signed)
 Pt present back pain, symptom started two weeks ago. Pt state has a history of back pain, pt took otc medication no relief.

## 2025-01-25 ENCOUNTER — Ambulatory Visit: Payer: Self-pay
